# Patient Record
Sex: Male | Born: 1980 | Hispanic: Yes | Marital: Married | Smoking: Former smoker
Health system: Southern US, Community
[De-identification: ages and names within clinical notes are randomized; demographics above are authoritative.]

## PROBLEM LIST (undated history)

## (undated) DIAGNOSIS — F909 Attention-deficit hyperactivity disorder, unspecified type: Secondary | ICD-10-CM

## (undated) HISTORY — PX: LEG SURGERY: SHX1003

## (undated) HISTORY — DX: Attention-deficit hyperactivity disorder, unspecified type: F90.9

---

## 2004-10-19 ENCOUNTER — Emergency Department: Payer: Self-pay | Admitting: Emergency Medicine

## 2011-02-19 ENCOUNTER — Emergency Department: Payer: Self-pay

## 2011-02-19 DIAGNOSIS — R002 Palpitations: Secondary | ICD-10-CM

## 2011-02-26 ENCOUNTER — Ambulatory Visit (INDEPENDENT_AMBULATORY_CARE_PROVIDER_SITE_OTHER): Payer: Self-pay | Admitting: Cardiovascular Disease

## 2011-02-26 ENCOUNTER — Encounter: Payer: Self-pay | Admitting: Cardiovascular Disease

## 2011-02-26 VITALS — BP 136/83 | HR 60 | Ht 70.0 in | Wt 295.0 lb

## 2011-02-26 DIAGNOSIS — R002 Palpitations: Secondary | ICD-10-CM | POA: Insufficient documentation

## 2011-02-26 NOTE — Assessment & Plan Note (Signed)
Jonathan Lam presents with some episodes of palpitations. His Holter monitor revealed premature atrial contractions and premature ventricular contractions. I have reassured him that these are very benign entities. I asked him to try to eat better and exercise. He informed that he stop smoking last week and is also eating a lot healthier. He wants to lose some weight.  I will see him again on as-needed basis.

## 2011-02-26 NOTE — Progress Notes (Signed)
Jonathan Lam Date of Birth  Aug 22, 1980 [facility] / [facility] [address]     [address] [place], [place]  [address] [phone]  Fax  [phone]  History of Present Illness:  Pt has a hx of anxiety and palpitations.   Has started eating better and stopped smoking recently.  Feeling better.  Has issues with chest pain / GERD.  Feels better with belching.  Had a Holter monitor - PACs and PVCs.  Brother in [place] passed away 3 weeks ago - ( stressed).  Has lot of anxiety.  No current outpatient prescriptions on file prior to visit.    No Known Allergies  History reviewed. No pertinent past medical history.  History reviewed. No pertinent past surgical history.  History  Smoking status  . Former Smoker -- 1.0 packs/day for 5 years  . Types: Cigarettes  . Quit date: 02/20/2011  Smokeless tobacco  . Not on file    History  Alcohol Use  . 1.8 oz/week  . 1 Glasses of wine, 1 Cans of beer, 1 Shots of liquor per week    Family History  Problem Relation Age of Onset  . Hypertension Father   . Heart attack Paternal Grandmother     Reviw of Systems:  Reviewed in the HPI.  All other systems are negative.  Physical Exam: BP 136/83  Pulse 60  Ht 5\' 10"  (1.778 m)  Wt 295 lb (133.811 kg)  BMI 42.33 kg/m2 The patient is alert and oriented x 3.  The mood and affect are normal.   Skin: warm and dry.  Color is normal.    HEENT:   the sclera are nonicteric.  The mucous membranes are moist.  The carotids are 2+ without bruits.  There is no thyromegaly.  There is no JVD.    Lungs: clear.  The chest wall is non tender.    Heart: regular rate with a normal S1 and S2.  There are no murmurs, gallops, or rubs. The PMI is not displaced.     Abdomin: good bowel sounds.  There is no guarding or rebound.  There is no hepatosplenomegaly or tenderness.  There are no masses.   Extremities:  no clubbing, cyanosis, or edema.  The legs are without rashes.   The distal pulses are intact.   Neuro:  Cranial nerves II - XII are intact.  Motor and sensory functions are intact.    The gait is normal.  ECG: Normal sinus rhythm. He has early repolarization abnormality  Assessment / Plan:

## 2011-02-26 NOTE — Patient Instructions (Signed)
You are doing well. No medication changes were made. Please call us if you have new issues that need to be addressed    

## 2011-03-05 ENCOUNTER — Encounter: Payer: Self-pay | Admitting: Cardiovascular Disease

## 2011-03-20 ENCOUNTER — Encounter: Payer: Self-pay | Admitting: Cardiovascular Disease

## 2011-03-25 ENCOUNTER — Encounter: Payer: Self-pay | Admitting: Cardiovascular Disease

## 2011-03-31 ENCOUNTER — Encounter: Payer: Self-pay | Admitting: Cardiovascular Disease

## 2011-05-01 ENCOUNTER — Encounter: Payer: Self-pay | Admitting: Cardiovascular Disease

## 2011-07-25 ENCOUNTER — Emergency Department: Payer: Self-pay | Admitting: Emergency Medicine

## 2017-12-23 ENCOUNTER — Encounter: Payer: Self-pay | Admitting: Emergency Medicine

## 2017-12-23 ENCOUNTER — Other Ambulatory Visit: Payer: Self-pay

## 2017-12-23 ENCOUNTER — Emergency Department: Payer: 59

## 2017-12-23 ENCOUNTER — Emergency Department
Admission: EM | Admit: 2017-12-23 | Discharge: 2017-12-23 | Disposition: A | Discharge status: Home / Self Care | Payer: 59 | Attending: Emergency Medicine | Admitting: Emergency Medicine

## 2017-12-23 DIAGNOSIS — Z87891 Personal history of nicotine dependence: Secondary | ICD-10-CM | POA: Diagnosis not present

## 2017-12-23 DIAGNOSIS — W228XXA Striking against or struck by other objects, initial encounter: Secondary | ICD-10-CM | POA: Diagnosis not present

## 2017-12-23 DIAGNOSIS — Y9301 Activity, walking, marching and hiking: Secondary | ICD-10-CM | POA: Diagnosis not present

## 2017-12-23 DIAGNOSIS — S99922A Unspecified injury of left foot, initial encounter: Secondary | ICD-10-CM | POA: Diagnosis present

## 2017-12-23 DIAGNOSIS — Y929 Unspecified place or not applicable: Secondary | ICD-10-CM | POA: Diagnosis not present

## 2017-12-23 DIAGNOSIS — Y999 Unspecified external cause status: Secondary | ICD-10-CM | POA: Diagnosis not present

## 2017-12-23 DIAGNOSIS — S93105A Unspecified dislocation of left toe(s), initial encounter: Secondary | ICD-10-CM | POA: Diagnosis not present

## 2017-12-23 NOTE — ED Triage Notes (Signed)
Patient ambulatory to triage with steady gait, without difficulty or distress noted; pt reports injuring left 2nd toe tonight while going up steps

## 2017-12-23 NOTE — ED Provider Notes (Signed)
[facility] Emergency Department Provider Note  ____________________________________________   First MD Initiated Contact with Patient 12/23/17 [phone]     (approximate)  I have reviewed the triage vital signs and the nursing notes.   HISTORY  Chief Complaint Toe Injury   HPI Jonathan Lam is a 37 y.o. male who self presents to the emergency department with sudden onset severe pain to his left second toe that happened this evening when he was walking upstairs in sandals and he stubbed his toe.  He said he noticed immediately that the toe was "crooked and facing to the left".  He then grabbed his toe and "snapped into place".  His pain was severe at that time but immediately improved.  He comes to the emergency department today to see if his toe was broken.  He denies numbness or weakness.  His pain is largely resolved.  History reviewed. No pertinent past medical history.  Patient Active Problem List   Diagnosis Date Noted  . Palpitations 02/26/2011    History reviewed. No pertinent surgical history.  Prior to Admission medications   Not on File    Allergies Patient has no known allergies.  Family History  Problem Relation Age of Onset  . Hypertension Father   . Heart attack Paternal Grandmother     Social History Social History   Tobacco Use  . Smoking status: Former Smoker    Packs/day: 1.00    Years: 5.00    Pack years: 5.00    Types: Cigarettes    Last attempt to quit: 02/20/2011    Years since quitting: 6.8  . Smokeless tobacco: Never Used  Substance Use Topics  . Alcohol use: Yes    Alcohol/week: 1.8 oz    Types: 1 Glasses of wine, 1 Cans of beer, 1 Shots of liquor per week  . Drug use: No    Review of Systems Constitutional: No fever/chills Cardiovascular: Denies chest pain. Respiratory: Denies shortness of breath. Musculoskeletal: Positive for toe pain Neurological: Negative for  headaches   ____________________________________________   PHYSICAL EXAM:  VITAL SIGNS: ED Triage Vitals  Enc Vitals Group     BP 12/23/17 0049 (!) 146/66     Pulse Rate 12/23/17 0049 69     Resp 12/23/17 0049 18     Temp 12/23/17 0049 98 F (36.7 C)     Temp Source 12/23/17 0049 Oral     SpO2 12/23/17 0049 98 %     Weight 12/23/17 0048 280 lb (127 kg)     Height 12/23/17 0048  (1.778 m)     Head Circumference --      Peak Flow --      Pain Score 12/23/17 0048 3     Pain Loc --      Pain Edu? --      Excl. in GC? --     Constitutional: Alert and oriented x4 joking laughing well-appearing nontoxic no diaphoresis speaks full clear sentences Head: Atraumatic. Nose: No congestion/rhinnorhea. Mouth/Throat: No trismus Neck: No stridor.   Respiratory: Normal respiratory effort.  No retractions. MSK: Left second toe mildly tender although not deformed.  Neurovascularly intact Neurologic:  Normal speech and language. No gross focal neurologic deficits are appreciated.  Skin:  Skin is warm, dry and intact. No rash noted.    ____________________________________________  LABS (all labs ordered are listed, but only abnormal results are displayed)  Labs Reviewed - No data to display   __________________________________________  EKG  ____________________________________________  RADIOLOGY  X-ray of the toe reviewed by me with no acute disease ____________________________________________   DIFFERENTIAL includes but not limited to  Toe fracture, toe dislocation, subungual hematoma   PROCEDURES  Procedure(s) performed: no  Procedures  Critical Care performed: no  Observation: no ____________________________________________   INITIAL IMPRESSION / ASSESSMENT AND PLAN / ED COURSE  Pertinent labs & imaging results that were available during my care of the patient were reviewed by me and considered in my medical decision making (see chart for  details).  X-ray confirms no fracture.  The toe is well aligned and neurovascularly intact.  The patient likely did have a dislocation that he self reduced.  I have advised him to wear hard soled shoes and to follow-up with primary care as needed.  Discharged home in improved condition.  He declines pain medication at this time.      ____________________________________________   FINAL CLINICAL IMPRESSION(S) / ED DIAGNOSES  Final diagnoses:  Toe dislocation, left, initial encounter      NEW MEDICATIONS STARTED DURING THIS VISIT:  There are no discharge medications for this patient.    Note:  This document was prepared using Dragon voice recognition software and may include unintentional dictation errors.      Merrily Brittle, MD 12/23/17 2227

## 2017-12-23 NOTE — Discharge Instructions (Signed)
Please wear hard soled shoes for the next week and return to the ED for any concerns.  It was a pleasure to take care of you today, and thank you for coming to our emergency department.  If you have any questions or concerns before leaving please ask the nurse to grab me and I'm more than happy to go through your aftercare instructions again.  If you were prescribed any opioid pain medication today such as Norco, Vicodin, Percocet, morphine, hydrocodone, or oxycodone please make sure you do not drive when you are taking this medication as it can alter your ability to drive safely.  If you have any concerns once you are home that you are not improving or are in fact getting worse before you can make it to your follow-up appointment, please do not hesitate to call 911 and come back for further evaluation.  Merrily Brittle, MD  No results found for this or any previous visit. Dg Toe 2nd Left  Result Date: 12/23/2017 CLINICAL DATA:  Left second toe injury EXAM: LEFT SECOND TOE COMPARISON:  None. FINDINGS: No acute displaced fracture or malalignment. No radiopaque foreign body in the soft tissues. IMPRESSION: No acute osseous abnormality Electronically Signed   By: Jasmine Pang M.D.   On: 12/23/2017 01:12

## 2017-12-23 NOTE — ED Notes (Signed)
Sleeping soundly in lobby with no distress noted

## 2019-02-02 ENCOUNTER — Other Ambulatory Visit: Payer: Self-pay | Admitting: Family Medicine

## 2019-02-02 DIAGNOSIS — R1011 Right upper quadrant pain: Secondary | ICD-10-CM

## 2019-02-10 ENCOUNTER — Ambulatory Visit: Payer: 59

## 2019-02-24 ENCOUNTER — Other Ambulatory Visit: Payer: Self-pay

## 2019-02-24 DIAGNOSIS — Z20822 Contact with and (suspected) exposure to covid-19: Secondary | ICD-10-CM

## 2019-03-01 LAB — NOVEL CORONAVIRUS, NAA: SARS-CoV-2, NAA: NOT DETECTED

## 2019-03-13 ENCOUNTER — Observation Stay
Admission: EM | Admit: 2019-03-13 | Discharge: 2019-03-14 | Disposition: A | Discharge status: Home / Self Care | Payer: 59 | Attending: Internal Medicine | Admitting: Internal Medicine

## 2019-03-13 ENCOUNTER — Other Ambulatory Visit: Payer: Self-pay

## 2019-03-13 DIAGNOSIS — Z87891 Personal history of nicotine dependence: Secondary | ICD-10-CM | POA: Diagnosis not present

## 2019-03-13 DIAGNOSIS — T63441A Toxic effect of venom of bees, accidental (unintentional), initial encounter: Secondary | ICD-10-CM | POA: Diagnosis not present

## 2019-03-13 DIAGNOSIS — X58XXXA Exposure to other specified factors, initial encounter: Secondary | ICD-10-CM | POA: Insufficient documentation

## 2019-03-13 DIAGNOSIS — Z20828 Contact with and (suspected) exposure to other viral communicable diseases: Secondary | ICD-10-CM | POA: Insufficient documentation

## 2019-03-13 DIAGNOSIS — T782XXA Anaphylactic shock, unspecified, initial encounter: Secondary | ICD-10-CM | POA: Diagnosis present

## 2019-03-13 LAB — CBC
HCT: 43.6 % (ref 39.0–52.0)
Hemoglobin: 14.7 g/dL (ref 13.0–17.0)
MCH: 27.7 pg (ref 26.0–34.0)
MCHC: 33.7 g/dL (ref 30.0–36.0)
MCV: 82.3 fL (ref 80.0–100.0)
Platelets: 324 10*3/uL (ref 150–400)
RBC: 5.3 MIL/uL (ref 4.22–5.81)
RDW: 12.3 % (ref 11.5–15.5)
WBC: 19.7 10*3/uL — ABNORMAL HIGH (ref 4.0–10.5)
nRBC: 0 % (ref 0.0–0.2)

## 2019-03-13 LAB — CREATININE, SERUM
Creatinine, Ser: 1.01 mg/dL (ref 0.61–1.24)
GFR calc Af Amer: >60 mL/min (ref >60)
GFR calc non Af Amer: >60 mL/min (ref >60)

## 2019-03-13 MED ORDER — FAMOTIDINE IN NACL 20-0.9 MG/50ML-% IV SOLN
20.0000 mg | Freq: Two times a day (BID) | INTRAVENOUS | Status: DC
  Administered 2019-03-13: 23:00:00 20 mg via INTRAVENOUS
  Filled 2019-03-13: qty 50

## 2019-03-13 MED ORDER — DIPHENHYDRAMINE HCL 50 MG/ML IJ SOLN
50.0000 mg | Freq: Once | INTRAMUSCULAR | Status: AC
  Administered 2019-03-13: 50 mg via INTRAVENOUS

## 2019-03-13 MED ORDER — FAMOTIDINE IN NACL 20-0.9 MG/50ML-% IV SOLN
20.0000 mg | Freq: Once | INTRAVENOUS | Status: AC
  Administered 2019-03-13: 20 mg via INTRAVENOUS

## 2019-03-13 MED ORDER — EPINEPHRINE PF 1 MG/ML IJ SOLN
0.3000 mL | Freq: Once | INTRAMUSCULAR | Status: AC
  Administered 2019-03-13: 19:00:00 0.3 mg via INTRAMUSCULAR

## 2019-03-13 MED ORDER — ACETAMINOPHEN 650 MG RE SUPP: 650.0000 mg | Freq: Four times a day (QID) | RECTAL | Status: DC | PRN

## 2019-03-13 MED ORDER — SODIUM CHLORIDE 0.9 % IV BOLUS
1000.0000 mL | Freq: Once | INTRAVENOUS | Status: AC
  Administered 2019-03-13: 1000 mL via INTRAVENOUS

## 2019-03-13 MED ORDER — ACETAMINOPHEN 325 MG PO TABS: 650.0000 mg | ORAL_TABLET | Freq: Four times a day (QID) | ORAL | Status: DC | PRN

## 2019-03-13 MED ORDER — ONDANSETRON HCL 4 MG PO TABS: 4.0000 mg | ORAL_TABLET | Freq: Four times a day (QID) | ORAL | Status: DC | PRN

## 2019-03-13 MED ORDER — ONDANSETRON HCL 4 MG/2ML IJ SOLN: 4.0000 mg | Freq: Four times a day (QID) | INTRAMUSCULAR | Status: DC | PRN

## 2019-03-13 MED ORDER — METHYLPREDNISOLONE SODIUM SUCC 125 MG IJ SOLR
60.0000 mg | Freq: Two times a day (BID) | INTRAMUSCULAR | Status: DC
  Administered 2019-03-13 – 2019-03-14 (×2): 60 mg via INTRAVENOUS
  Filled 2019-03-13 (×2): qty 2

## 2019-03-13 MED ORDER — ENOXAPARIN SODIUM 40 MG/0.4ML ~~LOC~~ SOLN
40.0000 mg | SUBCUTANEOUS | Status: DC
  Administered 2019-03-13: 40 mg via SUBCUTANEOUS
  Filled 2019-03-13: qty 0.4

## 2019-03-13 MED ORDER — DIPHENHYDRAMINE HCL 25 MG PO CAPS
25.0000 mg | ORAL_CAPSULE | Freq: Three times a day (TID) | ORAL | Status: DC | PRN
  Administered 2019-03-14: 11:00:00 25 mg via ORAL
  Filled 2019-03-13: qty 1

## 2019-03-13 NOTE — H&P (Signed)
[facility] [place] [facility]   PATIENT NAME: Jonathan Lam    MR#:  [id number]  DATE OF BIRTH:  May 18, 1981  DATE OF ADMISSION:  03/13/2019  PRIMARY CARE PHYSICIAN: Mickel FuchsWroth, Thomas H, MD   REQUESTING/REFERRING PHYSICIAN: Dorothea GlassmanPaul Malinda, MD  CHIEF COMPLAINT:   Chief Complaint  Patient presents with  . Allergic Reaction    HISTORY OF PRESENT ILLNESS:  Jonathan Lam  is a 38 y.o. male with no known prior medical illness.  He presented to the emergency room via EMS services after being stung by 3 yellow jackets on his right side of his head, right shoulder, and right hand.  He received EpiPen dose 45 minutes prior to arrival as well as 50 mg of Benadryl and 125 mg Solu-Medrol as well as 4 mg IV Zofran.  According to EMS services, oxygen saturations were 60% on room air on their arrival to the scene.  On arrival to the emergency room patient had nonrebreather in place with 100% oxygen saturations.  Patient was also noted to be hypotensive therefore he received an additional dose of epinephrine IM and 50 mg Benadryl and Solu-Medrol 125 mg as well as 20 mg IV Pepcid in the emergency room.  He continued to have erythema as well as some edema and therefore received 1 additional dose of epinephrine with complete clearing of the erythema.  Hypotension has improved.  Patient is currently awake, alert, and oriented x4 at the time that I am seeing him.  He denies any shortness of breath or pain.  He denies difficulty swallowing.  He denies urticaria.  He has been hospitalized with admission to the hospitalist service for observation overnight given anaphylactic reaction with prolonged symptoms.  PAST MEDICAL HISTORY:  No past medical history on file.  PAST SURGICAL HISTORY:  No past surgical history on file.  SOCIAL HISTORY:   Social History   Tobacco Use  . Smoking status: Former Smoker    Packs/day: 1.00    Years: 5.00    Pack years: 5.00    Types: Cigarettes   Quit date: 02/20/2011    Years since quitting: 8.0  . Smokeless tobacco: Never Used  Substance Use Topics  . Alcohol use: Yes    Alcohol/week: 3.0 standard drinks    Types: 1 Glasses of wine, 1 Cans of beer, 1 Shots of liquor per week    FAMILY HISTORY:   Family History  Problem Relation Age of Onset  . Hypertension Father   . Heart attack Paternal Grandmother     DRUG ALLERGIES:   Allergies  Allergen Reactions  . Yellow Jacket Venom [Bee Venom] Anaphylaxis    REVIEW OF SYSTEMS:   Review of Systems  Constitutional: Negative for chills, fever and malaise/fatigue.  HENT: Negative for congestion, hearing loss, sinus pain and sore throat.   Eyes: Negative for blurred vision, double vision and pain.  Respiratory: Negative for cough, hemoptysis, shortness of breath and wheezing.   Cardiovascular: Negative for chest pain and palpitations.  Gastrointestinal: Negative for abdominal pain, constipation, diarrhea, heartburn, nausea and vomiting.  Genitourinary: Negative for dysuria, flank pain and hematuria.  Musculoskeletal: Negative for falls and myalgias.  Skin: Negative for itching and rash.       Stung by yellow jacket on left hand, right side of his head, and right shoulder  Neurological: Negative for dizziness, focal weakness, seizures, loss of consciousness, weakness and headaches.  Psychiatric/Behavioral: Negative.  Negative for depression.     MEDICATIONS AT HOME:  Prior to Admission medications   Medication Sig Start Date End Date Taking? Authorizing Provider  neomycin-polymyxin b-dexamethasone (MAXITROL) 3.5-10000-0.1 SUSP Place 1 drop into both eyes daily. 02/25/19   [provider]      VITAL SIGNS:  Blood pressure 127/62, pulse 64, temperature 98.1 F (36.7 C), temperature source Oral, resp. rate 18, height 5\' 8"  (1.727 m), weight 130.6 kg, SpO2 100 %.  PHYSICAL EXAMINATION:  Physical Exam  GENERAL:  38 y.o.-year-old patient lying in the bed with no  acute distress.  EYES: Pupils equal, round, reactive to light and accommodation. No scleral icterus. Extraocular muscles intact.  HEENT: Head atraumatic, normocephalic. Oropharynx and nasopharynx clear.  NECK:  Supple, no jugular venous distention. No thyroid enlargement, no tenderness.  LUNGS: Normal breath sounds bilaterally, no wheezing, rales,rhonchi or crepitation. No use of accessory muscles of respiration.  CARDIOVASCULAR: Regular rate and rhythm, S1, S2 normal. No murmurs, rubs, or gallops.  ABDOMEN: Soft, nondistended, nontender. Bowel sounds present. No organomegaly or mass.  EXTREMITIES: No pedal edema, cyanosis, or clubbing.  NEUROLOGIC: Cranial nerves II through XII are intact. Muscle strength 5/5 in all extremities. Sensation intact. Gait not checked.  PSYCHIATRIC: The patient is alert and oriented x 3.  Normal affect and good eye contact. SKIN:erythemma and slight edema to right hand  LABORATORY PANEL:   CBC Recent Labs  Lab 03/13/19 2141  WBC 19.7*  HGB 14.7  HCT 43.6  PLT 324   ------------------------------------------------------------------------------------------------------------------  Chemistries  Recent Labs  Lab 03/13/19 2141  CREATININE 1.01   ------------------------------------------------------------------------------------------------------------------  Cardiac Enzymes No results for input(s): TROPONINI in the last 168 hours. ------------------------------------------------------------------------------------------------------------------  RADIOLOGY:  No results found.    IMPRESSION AND PLAN:   1.  Anaphylaxis to bee sting - Patient received a total of 2 doses IV Solu-Medrol with 100 mg IV Benadryl and epinephrine dose x3 as well as 20 mg IV Pepcid - We will continue additional 2 doses of Solu-Medrol with PRN Benadryl and twice daily Pepcid. - Patient admitted for overnight observation with prolonged symptomatology  2. hypotension  -Improved with epinephrine -Continue to monitor every 4 hour blood pressure -Telemetry monitoring -Patient received 1 L normal saline bolus in the emergency room  3.  Leukocytosis - Likely secondary to anaphylactic reaction and receiving IV steroid therapy -Repeat CBC in the a.m.  DVT and PPI prophylaxis    All the records are reviewed and case discussed with ED provider. The plan of care was discussed in details with the patient (and family). I answered all questions. The patient agreed to proceed with the above mentioned plan. Further management will depend upon hospital course.   CODE STATUS: Full code  TOTAL TIME TAKING CARE OF THIS PATIENT: 45 minutes.    Theo Dills Roopa Graver crnpon 03/13/2019 at 11:38 PM  Pager - [phone]  After 6pm go to [email or web address] - Proofreader  [facility]  Office  [phone]  CC: Primary care physician; Elba Barman, MD   Note: This dictation was prepared with Dragon dictation along with smaller phrase technology. Any transcriptional errors that result from this process are unintentional.

## 2019-03-13 NOTE — ED Notes (Signed)
BP cycling q39min

## 2019-03-13 NOTE — ED Notes (Signed)
Pt took off NRB. Pt sating 98%on RA at this time.

## 2019-03-13 NOTE — ED Notes (Signed)
Pt provided with a meal tray, water and warm blankets.

## 2019-03-13 NOTE — ED Notes (Addendum)
Pt oxygen turned down to 13 L via NRB. Sating at 100%. VO MD Cinda Quest

## 2019-03-13 NOTE — ED Notes (Signed)
Eyes closed, resp even and unlabored; pt wearing non-rebreather; sats 100%

## 2019-03-13 NOTE — ED Notes (Signed)
Pt awake, talking on cell phone with wife; noted to have removed non-rebreather since last observed-sats 99-100% on room air; pt says it was irritating him; pt given mask as requested; says feeling some better; side rails up x 2; call bell in reach; reports his "butt hole is burning"; notified MD and acknowledged;

## 2019-03-13 NOTE — Progress Notes (Signed)
Responded to patient arrival. No RT interventions needed at this time.

## 2019-03-13 NOTE — ED Notes (Addendum)
Pt oxygen turned down to 10 L via NRB. Sating at 100%. VO MD Cinda Quest

## 2019-03-13 NOTE — ED Provider Notes (Signed)
[facility] Emergency Department Provider Note   ____________________________________________   First MD Initiated Contact with Patient 03/13/19 2107     (approximate)  I have reviewed the triage vital signs and the nursing notes.   HISTORY  Chief Complaint Allergic Reaction    HPI Jonathan Lam is a 38 y.o. male who was up on his rib pain in his shoulders when he was stung by 3 yellow jackets.  He reports he almost passed out.  EMS gave him an EpiPen about 45 minutes prior to arrival and 50 mg of Benadryl 125 sign Medrol and 4 of Zofran.  They report his O2 sats were 60% upon arrival there.  He is on 100% nonrebreather satting at 100%.  On arrival here he is not wheezing any longer but he is diffusely red and itchy.  His blood pressure is low and drops down to about 818 systolic.  We give him another epi 0.3 IM another 50 Benadryl Solu-Medrol and 20 of Pepcid IV.  Patient looks less red and itchy blood pressure comes up and then goes down again.  He received further epinephrine which results in complete clearing of his redness raising of his blood pressure and pulse rate and a brief headache which resolved.  Because of patient's anaphylactic reaction to bee sting and prolonged symptoms I will give him in the hospital overnight we will watch him.         No past medical history on file.  Patient Active Problem List   Diagnosis Date Noted  . 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 Palpitations 02/26/2011    No past surgical history on file.  Prior to Admission medications   Medication Sig Start Date End Date Taking? Authorizing Provider  neomycin-polymyxin b-dexamethasone (MAXITROL) 3.5-10000-0.1 SUSP Place 1 drop into both eyes daily. 02/25/19   [provider]    Allergies Yellow jacket venom [bee venom]  Family History  Problem Relation Age of Onset  . Hypertension Father   . Heart attack Paternal Grandmother     Social  History Social History   Tobacco Use  . Smoking status: Former Smoker    Packs/day: 1.00    Years: 5.00    Pack years: 5.00    Types: Cigarettes    Quit date: 02/20/2011    Years since quitting: 8.0  . Smokeless tobacco: Never Used  Substance Use Topics  . Alcohol use: Yes    Alcohol/week: 3.0 standard drinks    Types: 1 Glasses of wine, 1 Cans of beer, 1 Shots of liquor per week  . Drug use: No    Review of Systems  Constitutional: No fever/chills Eyes: No visual changes. ENT: No sore throat. Cardiovascular: Denies chest pain. Respiratory: shortness of breath. Gastrointestinal: No abdominal pain.  No nausea, no vomiting.  No diarrhea.  No constipation. Genitourinary: Negative for dysuria. Musculoskeletal: Negative for back pain. Skin: Diffuse rash. Neurological: Negative for headaches, focal weakness   ____________________________________________   PHYSICAL EXAM:  VITAL SIGNS: ED Triage Vitals  Enc Vitals Group     BP 03/13/19 1846 (!) 154/139     Pulse Rate 03/13/19 1852 72     Resp 03/13/19 1852 17     Temp --      Temp src --      SpO2 03/13/19 1852 100 %     Weight 03/13/19 1907 288 lb (130.6 kg)     Height 03/13/19 1907 5\' 8"  (1.727 m)  Head Circumference --      Peak Flow --      Pain Score --      Pain Loc --      Pain Edu? --      Excl. in GC? --     Constitutional: Alert and oriented.  Feels ill and tired Eyes: Conjunctivae are normal. PERRL. EOMI. Head: Atraumatic. Nose: No congestion/rhinnorhea. Mouth/Throat: Mucous membranes are moist.  Oropharynx non-erythematous. Neck: No stridor. Cardiovascular: Normal rate, regular rhythm. Grossly normal heart sounds.  Good peripheral circulation. Respiratory: Normal respiratory effort.  No retractions. Lungs CTAB. Gastrointestinal: Soft and nontender. No distention. No abdominal bruits. No CVA tenderness. Musculoskeletal: No lower extremity tenderness nor edema.   Neurologic:  Normal speech and  language. No gross focal neurologic deficits are appreciated.  Skin: Skin is sweaty diffusely red there are some hives on the legs.  ____________________________________________   LABS (all labs ordered are listed, but only abnormal results are displayed)  Labs Reviewed  SARS CORONAVIRUS 2  HIV ANTIBODY (ROUTINE TESTING W REFLEX)  CBC  CREATININE, SERUM  BASIC METABOLIC PANEL  CBC   ____________________________________________  EKG  EKG read and interpreted by me shows normal sinus rhythm rate of 82 normal axis there is some PR segment depression in lead II baseline irregularity.  No acute ST changes.  Computer is reading borderline ST elevation laterally I believe this is more PR segment depression. ____________________________________________  RADIOLOGY  ED MD interpretation:    Official radiology report(s): No results found.  ____________________________________________   PROCEDURES  Procedure(s) performed (including Critical Care): Medical care time 45 minutes this is at the bedside.  Procedures   ____________________________________________   INITIAL IMPRESSION / ASSESSMENT AND PLAN / ED COURSE  Jonathan Lam was evaluated in Emergency Department on 03/13/2019 for the symptoms described in the history of present illness. He was evaluated in the context of the global COVID-19 pandemic, which necessitated consideration that the patient might be at risk for infection with the SARS-CoV-2 virus that causes COVID-19. Institutional protocols and algorithms that pertain to the evaluation of patients at risk for COVID-19 are in a state of rapid change based on information released by regulatory bodies including the CDC and federal and state organizations. These policies and algorithms were followed during the patient's care in the ED.    Patient is stable with the blood pressure in the 100 and teens systolic diastolic in the high 50s low 60s respiratory rate 11 heart rate  in the 70s to 80s.  He looks much better now we will get him admitted and monitored to make sure he does not deteriorate again.         ____________________________________________   FINAL CLINICAL IMPRESSION(S) / ED DIAGNOSES  Final diagnoses:  None     ED Discharge Orders    None       Note:  This document was prepared using Dragon voice recognition software and may include unintentional dictation errors.    Arnaldo NatalMalinda, Day Greb F, MD 03/13/19 2206

## 2019-03-13 NOTE — ED Notes (Signed)
BP cycling q44min.

## 2019-03-13 NOTE — ED Notes (Signed)
Selas MD Provider at bedside.

## 2019-03-13 NOTE — ED Notes (Signed)
Pt  AO x 4

## 2019-03-13 NOTE — ED Triage Notes (Addendum)
Pt from home via [facility]. Per EMS, pt stung by a yellow jacket about an hour ago. Meds given by [facility]; Epi pen given 61min ago; 50mg  benadryl; 125 solumendrol; 4 zofran. Per EMS pt oxygen level 60% upon arrival to pt's home,  pt arrives on 15L via NRB sating 100%. Redness noted all over body, scratches on chest and legs noted.  MD MAlinda at bedside .

## 2019-03-13 NOTE — ED Notes (Signed)
Spouse at bedside at this time.

## 2019-03-14 LAB — CBC
HCT: 42.6 % (ref 39.0–52.0)
Hemoglobin: 14.1 g/dL (ref 13.0–17.0)
MCH: 27.4 pg (ref 26.0–34.0)
MCHC: 33.1 g/dL (ref 30.0–36.0)
MCV: 82.7 fL (ref 80.0–100.0)
Platelets: 329 10*3/uL (ref 150–400)
RBC: 5.15 MIL/uL (ref 4.22–5.81)
RDW: 12.5 % (ref 11.5–15.5)
WBC: 9.7 10*3/uL (ref 4.0–10.5)
nRBC: 0 % (ref 0.0–0.2)

## 2019-03-14 LAB — BASIC METABOLIC PANEL
Anion gap: 4 — ABNORMAL LOW (ref 5–15)
BUN: 15 mg/dL (ref 6–20)
CO2: 22 mmol/L (ref 22–32)
Calcium: 8.6 mg/dL — ABNORMAL LOW (ref 8.9–10.3)
Chloride: 113 mmol/L — ABNORMAL HIGH (ref 98–111)
Creatinine, Ser: 0.84 mg/dL (ref 0.61–1.24)
GFR calc Af Amer: >60 mL/min (ref >60)
GFR calc non Af Amer: >60 mL/min (ref >60)
Glucose, Bld: 168 mg/dL — ABNORMAL HIGH (ref 70–99)
Potassium: 4.2 mmol/L (ref 3.5–5.1)
Sodium: 139 mmol/L (ref 135–145)

## 2019-03-14 LAB — SARS CORONAVIRUS 2 (TAT 6-24 HRS): SARS Coronavirus 2: NEGATIVE

## 2019-03-14 MED ORDER — EPINEPHRINE 0.3 MG/0.3ML IJ SOAJ: 0.3000 mg | INTRAMUSCULAR | 0 refills | Status: DC | PRN

## 2019-03-14 MED ORDER — PREDNISONE 10 MG PO TABS: ORAL_TABLET | ORAL | 0 refills | Status: DC

## 2019-03-14 MED ORDER — EPINEPHRINE 0.3 MG/0.3ML IJ SOAJ: 0.3000 mg | INTRAMUSCULAR | 0 refills | Status: AC | PRN

## 2019-03-14 NOTE — Progress Notes (Signed)
Patient ID: Jonathan Lam   Patient was admitted to [facility] on 03/13/2019 and discharged 03/14/2019.  May return to work on Friday 03/18/2019.  Dr Loletha Grayer [phone]

## 2019-03-14 NOTE — Discharge Summary (Signed)
[facility] [place] NAME: Jonathan Lam    MR#:  [id number]  DATE OF BIRTH:  08/28/1980  DATE OF ADMISSION:  03/13/2019 ADMITTING PHYSICIAN: Christel Mormon, MD  DATE OF DISCHARGE: 03/14/2019 12:54 PM  PRIMARY CARE PHYSICIAN: Elba Barman, MD    ADMISSION DIAGNOSIS:  Anaphylaxis, initial encounter [T78.2XXA]  DISCHARGE DIAGNOSIS:  Active Problems:   Anaphylactic reaction to bee sting   SECONDARY DIAGNOSIS:  History reviewed. No pertinent past medical history.  HOSPITAL COURSE:   1.  Anaphylactic reaction to yellow jacket stings.  Patient had shortness of breath hives and swelling.  Patient feeling much better now.  Patient received epinephrine steroids and Pepcid.  PRN Benadryl.  I will prescribe a prednisone taper upon going home.  I also prescribed an EpiPen generic.  Refer to allergist as outpatient. 2.  COVID-19 test was done as a send out and still pending at this time.  He did have one a few weeks ago which was negative.   DISCHARGE CONDITIONS:   Satisfactory  CONSULTS OBTAINED:  None  DRUG ALLERGIES:   Allergies  Allergen Reactions  . Yellow Jacket Venom [Bee Venom] Anaphylaxis    DISCHARGE MEDICATIONS:   Allergies as of 03/14/2019      Reactions   Yellow Jacket Venom [bee Venom] Anaphylaxis      Medication List    TAKE these medications   EPINEPHrine 0.3 mg/0.3 mL Soaj injection Commonly known as: EPI-PEN Inject 0.3 mLs (0.3 mg total) into the muscle as needed for anaphylaxis. Can substitute generic (or cheapest)   neomycin-polymyxin b-dexamethasone 3.5-10000-0.1 Susp Commonly known as: MAXITROL Place 1 drop into both eyes daily.   predniSONE 10 MG tablet Commonly known as: DELTASONE 4 tabs po day 1; 3 tabs po day2; 2 tabs po day3; 1 tab po day4; 1/2 tab po day5,6        DISCHARGE INSTRUCTIONS:   Follow-up PMD 5 days  If you experience worsening of your admission symptoms, develop shortness of  breath, life threatening emergency, suicidal or homicidal thoughts you must seek medical attention immediately by calling 911 or calling your MD immediately  if symptoms less severe.  You Must read complete instructions/literature along with all the possible adverse reactions/side effects for all the Medicines you take and that have been prescribed to you. Take any new Medicines after you have completely understood and accept all the possible adverse reactions/side effects.   Please note  You were cared for by a hospitalist during your hospital stay. If you have any questions about your discharge medications or the care you received while you were in the hospital after you are discharged, you can call the unit and asked to speak with the hospitalist on call if the hospitalist that took care of you is not available. Once you are discharged, your primary care physician will handle any further medical issues. Please note that NO REFILLS for any discharge medications will be authorized once you are discharged, as it is imperative that you return to your primary care physician (or establish a relationship with a primary care physician if you do not have one) for your aftercare needs so that they can reassess your need for medications and monitor your lab values.    Today   CHIEF COMPLAINT:   Chief Complaint  Patient presents with  . Allergic Reaction    HISTORY OF PRESENT ILLNESS:  Jonathan Lam  is a 38 y.o. male presented after allergic  reaction after yellowjacket stings   VITAL SIGNS:  Blood pressure 116/65, pulse 75, temperature 98.3 F (36.8 C), temperature source Oral, resp. rate 18, height 5\' 8"  (1.727 m), weight 130.6 kg, SpO2 99 %.   PHYSICAL EXAMINATION:  GENERAL:  38 y.o.-year-old patient lying in the bed with no acute distress.  EYES: Pupils equal, round, reactive to light and accommodation. No scleral icterus. Extraocular muscles intact.  HEENT: Head atraumatic,  normocephalic. Oropharynx and nasopharynx clear.  NECK:  Supple, no jugular venous distention. No thyroid enlargement, no tenderness.  LUNGS: Normal breath sounds bilaterally, no wheezing, rales,rhonchi or crepitation. No use of accessory muscles of respiration.  CARDIOVASCULAR: S1, S2 normal. No murmurs, rubs, or gallops.  ABDOMEN: Soft, non-tender, non-distended. Bowel sounds present. No organomegaly or mass.  EXTREMITIES: No pedal edema, cyanosis, or clubbing.  NEUROLOGIC: Cranial nerves II through XII are intact. Muscle strength 5/5 in all extremities. Sensation intact. Gait not checked.  PSYCHIATRIC: The patient is alert and oriented x 3.  SKIN: No obvious rash, lesion, or ulcer.   DATA REVIEW:   CBC Recent Labs  Lab 03/14/19 0414  WBC 9.7  HGB 14.1  HCT 42.6  PLT 329    Chemistries  Recent Labs  Lab 03/14/19 0414  NA 139  K 4.2  CL 113*  CO2 22  GLUCOSE 168*  BUN 15  CREATININE 0.84  CALCIUM 8.6*     Microbiology Results  Results for orders placed or performed in visit on 02/24/19  Novel Coronavirus, NAA ([facility])     Status: None   Collection Time: 02/24/19 12:00 AM  Result Value Ref Range Status   SARS-CoV-2, NAA Not Detected Not Detected Final    Comment: Testing was performed using the cobas(R) SARS-CoV-2 test. This test was developed and its performance characteristics determined by [facility]. This test has not been FDA cleared or approved. This test has been authorized by FDA under an Emergency Use Authorization (EUA). This test is only authorized for the duration of time the declaration that circumstances exist justifying the authorization of the emergency use of in vitro diagnostic tests for detection of SARS-CoV-2 virus and/or diagnosis of COVID-19 infection under section 564(b)(1) of the Act, 21 U.S.C. 295AOZ-3(Y)(8360bbb-3(b)(1), unless the authorization is terminated or revoked sooner. When diagnostic testing is negative, the possibility of a  false negative result should be considered in the context of a patient's recent exposures and the presence of clinical signs and symptoms consistent with COVID-19. An individual without symptoms of COVID-19 and who is not shedding SARS-CoV-2 virus would expect to have a negati ve (not detected) result in this assay.        Management plans discussed with the patient, and he is agreement.  CODE STATUS:  Code Status History    Date Active Date Inactive Code Status Order ID Comments User Context   03/13/2019 2106 03/14/2019 1600 Full Code [id number]  Pearletha AlfredSeals, Angela H, NP ED   Advance Care Planning Activity      TOTAL TIME TAKING CARE OF THIS PATIENT: 35 minutes.    Alford Highlandichard Taite Schoeppner M.D on 03/14/2019 at 4:24 PM  Between 7am to 6pm - Pager - [phone]  After 6pm go to [email or web address] - password [email or web address]  [facility] Office  [phone]  CC: Primary care physician; Mickel FuchsWroth, Thomas H, MD

## 2019-03-14 NOTE — Progress Notes (Signed)
Pt is being discharge home.  Discharge papers given and explained to pt. Pt verbalized understanding.  Meds and f/u appointments reviewed. Rx given. Awaiting transportation.

## 2019-03-14 NOTE — Progress Notes (Signed)
Pt c/o itching to bilateral hands. Denies sob.  Dr Leslye Peer made aware.  Per MD to give benadryl and ok to d/c home.

## 2019-03-15 LAB — HIV ANTIBODY (ROUTINE TESTING W REFLEX): HIV Screen 4th Generation wRfx: NONREACTIVE

## 2019-03-16 ENCOUNTER — Telehealth: Payer: 59 | Admitting: Family

## 2019-03-16 DIAGNOSIS — T63441A Toxic effect of venom of bees, accidental (unintentional), initial encounter: Secondary | ICD-10-CM

## 2019-03-16 NOTE — Progress Notes (Signed)
Based on what you shared with me, I feel your condition warrants further evaluation and I recommend that you be seen for a face to face office visit.  Given your symptoms, it would be best if you were evaluated face to face to rule out causes. If you are still having any swelling or shortness of breath you need to go to the ED now!   NOTE: If you entered your credit card information for this eVisit, you will not be charged. You may see a "hold" on your card for the $35 but that hold will drop off and you will not have a charge processed.  If you are having a true medical emergency please call 911.     For an urgent face to face visit, [place] has five urgent care centers for your convenience:    [email or web address] to reserve your spot online an avoid wait times  [facility] (New Address!) [address] [place], [place] [address] *Just off [address], across the road from [facility] hours of operation: Monday-Friday, 12 PM to 6 PM  Closed Saturday & Sunday   The following sites will take your insurance:  . [facility]    [phone]                  Get Driving Directions  [address], [place] [address] . 10 am to 8 pm Monday-Friday . 12 pm to 8 pm Saturday-Sunday   . [facility] at [place]                  Get Driving Directions  [address], [place], [place] [address] . 8 am to 8 pm Monday-Friday . 9 am to 6 pm Saturday . 11 am to 6 pm Sunday   . [facility] at [place]                  Get Driving Directions   [address] [place], [place] [address] . 8 am to 8 pm Monday-Friday . 8 am to 4 pm Saturday-Sunday    . [facility] at [place]                    Get Driving Directions  [phone]  [address] [place], [place] [address]  .  Monday-Friday, 12 PM to 6 PM    Your e-visit answers were reviewed by a board certified advanced clinical practitioner to complete your personal care plan.  Thank you for using e-Visits.

## 2019-05-24 ENCOUNTER — Other Ambulatory Visit: Payer: Self-pay

## 2019-05-24 DIAGNOSIS — Z20822 Contact with and (suspected) exposure to covid-19: Secondary | ICD-10-CM

## 2019-05-26 LAB — NOVEL CORONAVIRUS, NAA: SARS-CoV-2, NAA: NOT DETECTED

## 2019-08-30 ENCOUNTER — Ambulatory Visit (INDEPENDENT_AMBULATORY_CARE_PROVIDER_SITE_OTHER): Payer: 59 | Admitting: Cardiology

## 2019-08-30 ENCOUNTER — Other Ambulatory Visit: Payer: Self-pay

## 2019-08-30 ENCOUNTER — Encounter: Payer: Self-pay | Admitting: Cardiology

## 2019-08-30 VITALS — BP 123/80 | HR 61 | Ht 70.0 in | Wt 295.5 lb

## 2019-08-30 DIAGNOSIS — K2100 Gastro-esophageal reflux disease with esophagitis, without bleeding: Secondary | ICD-10-CM

## 2019-08-30 DIAGNOSIS — F419 Anxiety disorder, unspecified: Secondary | ICD-10-CM

## 2019-08-30 DIAGNOSIS — F17200 Nicotine dependence, unspecified, uncomplicated: Secondary | ICD-10-CM | POA: Diagnosis not present

## 2019-08-30 NOTE — Patient Instructions (Addendum)
Medication Instructions:  - Your physician has recommended you make the following change in your medication:   1) You may take prilosec over the counter: take 1 tablet (20 mg) by mouth once daily   *If you need a refill on your cardiac medications before your next appointment, please call your pharmacy*  Lab Work: - none ordered  If you have labs (blood work) drawn today and your tests are completely normal, you will receive your results only by: Marland Kitchen MyChart Message (if you have MyChart) OR . A paper copy in the mail If you have any lab test that is abnormal or we need to change your treatment, we will call you to review the results.  Testing/Procedures: - none ordered  Follow-Up: At [facility], you and your health needs are our priority.  As part of our continuing mission to provide you with exceptional heart care, we have created designated Provider Care Teams.  These Care Teams include your primary Cardiologist (physician) and Advanced Practice Providers (APPs -  Physician Assistants and Nurse Practitioners) who all work together to provide you with the care you need, when you need it.  Your next appointment:    as needed  The format for your next appointment:   n/a  Provider:   Debbe Odea, MD  Other Instructions Follow up with your Primary Care Doctor

## 2019-08-30 NOTE — Progress Notes (Signed)
Cardiology Office Note:    Date:  08/30/2019   ID:  Jonathan Lam, DOB 02/11/1981, MRN [id number]  PCP:  Jonathan Barman, MD  Cardiologist:  Jonathan Sable, MD  Electrophysiologist:  None   Referring MD: Jonathan Barman, MD   Chief Complaint  Patient presents with  . OTHER    C/o irregular heart beat. Meds reviewed verbally with pt.    History of Present Illness:    Jonathan Lam is a 39 y.o. male with a hx of anxiety, smoker who presents due to abnormal heartbeats.  Patient states having symptoms of anxiety and being anxious over the past 10 to 15 years now.  He has not really sought out treatment for anxiety.  Sometimes he has acid reflux symptoms which trigger him becoming anxious.  When he becomes anxious, he noticed his heart racing and resolves when he calms down.  A month ago, he got out of his car too quickly and felt a little bit dizzy.  This has not happened since.  He is also anxious regarding his weight.  He used to smoke cigarettes but currently uses E cigarettes.  He complains of generalized pain in his left arm, right leg, sometimes chest, and face.  Also has some numbness and tingling in the fingers when he is anxious, associated with rapid heart rates.  He denies shortness of breath.  Symptoms are not related with exertion.  History reviewed. No pertinent past medical history.  Past Surgical History:  Procedure Laterality Date  . LEG SURGERY Right     Current Medications: Current Meds  Medication Sig  . Cyanocobalamin (B-12 PO) Take by mouth daily.  Marland Kitchen EPINEPHrine 0.3 mg/0.3 mL IJ SOAJ injection Inject 0.3 mLs (0.3 mg total) into the muscle as needed for anaphylaxis. Can substitute generic (or cheapest)  . ergocalciferol (VITAMIN D2) 1.25 MG (50000 UT) capsule Take 50,000 Units by mouth once a week.     Allergies:   Yellow jacket venom [bee venom]   Social History   Socioeconomic History  . Marital status: Married    Spouse name: Not on file  .  Number of children: Not on file  . Years of education: Not on file  . Highest education level: Not on file  Occupational History  . Not on file  Tobacco Use  . Smoking status: Current Every Day Smoker    Packs/day: 1.50    Years: 20.00    Pack years: 30.00    Types: Cigarettes    Last attempt to quit: 02/20/2011    Years since quitting: 8.5  . Smokeless tobacco: Never Used  Substance and Sexual Activity  . Alcohol use: Not Currently    Alcohol/week: 3.0 standard drinks    Types: 1 Glasses of wine, 1 Cans of beer, 1 Shots of liquor per week  . Drug use: No  . Sexual activity: Not on file  Other Topics Concern  . Not on file  Social History Narrative  . Not on file   Social Determinants of Health   Financial Resource Strain:   . Difficulty of Paying Living Expenses: Not on file  Food Insecurity:   . Worried About Charity fundraiser in the Last Year: Not on file  . Ran Out of Food in the Last Year: Not on file  Transportation Needs:   . Lack of Transportation (Medical): Not on file  . Lack of Transportation (Non-Medical): Not on file  Physical Activity:   . Days of Exercise per  Week: Not on file  . Minutes of Exercise per Session: Not on file  Stress:   . Feeling of Stress : Not on file  Social Connections:   . Frequency of Communication with Friends and Family: Not on file  . Frequency of Social Gatherings with Friends and Family: Not on file  . Attends Religious Services: Not on file  . Active Member of Clubs or Organizations: Not on file  . Attends Banker Meetings: Not on file  . Marital Status: Not on file     Family History: The patient's family history includes Heart attack in his paternal grandmother; Hypertension in his father.  ROS:   Please see the history of present illness.     All other systems reviewed and are negative.  EKGs/Labs/Other Studies Reviewed:    The following studies were reviewed today:   EKG:  EKG is  ordered today.   The ekg ordered today demonstrates normal sinus rhythm, normal ECG.  Recent Labs: 03/14/2019: BUN 15; Creatinine, Ser 0.84; Hemoglobin 14.1; Platelets 329; Potassium 4.2; Sodium 139  Recent Lipid Panel No results found for: CHOL, TRIG, HDL, CHOLHDL, VLDL, LDLCALC, LDLDIRECT  Physical Exam:    VS:  BP 123/80 (BP Location: Right Arm, Patient Position: Sitting, Cuff Size: Large)   Pulse 61   Ht 5\' 10"  (1.778 m)   Wt 295 lb 8 oz (134 kg)   SpO2 98%   BMI 42.40 kg/m     Wt Readings from Last 3 Encounters:  08/30/19 295 lb 8 oz (134 kg)  03/13/19 288 lb (130.6 kg)  12/23/17 280 lb (127 kg)     GEN:  Well nourished, well developed in no acute distress, obese HEENT: Normal NECK: No JVD; No carotid bruits LYMPHATICS: No lymphadenopathy CARDIAC: RRR, no murmurs, rubs, gallops RESPIRATORY:  Clear to auscultation without rales, wheezing or rhonchi  ABDOMEN: Soft, non-tender, non-distended MUSCULOSKELETAL:  No edema; No deformity  SKIN: Warm and dry NEUROLOGIC:  Alert and oriented x 3 PSYCHIATRIC:  Normal affect   ASSESSMENT:    1. Gastroesophageal reflux disease with esophagitis without hemorrhage   2. Morbid obesity (HCC)   3. Smoking   4. Anxiety    PLAN:    In order of problems listed above:  Patient symptoms of increased heart rate and likely secondary to anxiety.  He has no risk factors of hypertension, hyperlipidemia, diabetes.  He is very young and symptoms and not cardiac related from patient history and physical so far.  I do not believe there is any indication for cardiac testing at this point.  1. Patient complains of acid reflux symptoms at occasionally triggers his anxiety.  Management of acid reflux might help reduce his anxiety symptoms.  Commended over-the-counter Prilosec 20 mg 1 tab daily.  He can follow-up with his primary care provider if symptoms persist. 2. I counseled patient on low calorie diet and exercise to help with weight loss.  Over 20 minutes spent for  this.  Nutritionist referral can be obtained through his primary care provider. 3. Smoking cessation advised.  Over 5 minutes spent.  Patient was educated on the harmful effects of e-cigarettes. 4. Patient states having symptoms of anxiety for 10 to 15 years.  I believe a psych or behavioral health referral will be important to manage patient's symptoms.  His primary care provider if comfortable with management of anxiety can also perform this.  Follow-up as needed    This note was generated in part or whole  with voice recognition software. Voice recognition is usually quite accurate but there are transcription errors that can and very often do occur. I apologize for any typographical errors that were not detected and corrected.  Medication Adjustments/Labs and Tests Ordered: Current medicines are reviewed at length with the patient today.  Concerns regarding medicines are outlined above.  Orders Placed This Encounter  Procedures  . EKG 12-Lead   No orders of the defined types were placed in this encounter.   Patient Instructions  Medication Instructions:  - Your physician has recommended you make the following change in your medication:   1) You may take prilosec over the counter: take 1 tablet (20 mg) by mouth once daily   *If you need a refill on your cardiac medications before your next appointment, please call your pharmacy*  Lab Work: - none ordered  If you have labs (blood work) drawn today and your tests are completely normal, you will receive your results only by: Marland Kitchen MyChart Message (if you have MyChart) OR . A paper copy in the mail If you have any lab test that is abnormal or we need to change your treatment, we will call you to review the results.  Testing/Procedures: - none ordered  Follow-Up: At [facility], you and your health needs are our priority.  As part of our continuing mission to provide you with exceptional heart care, we have created designated  Provider Care Teams.  These Care Teams include your primary Cardiologist (physician) and Advanced Practice Providers (APPs -  Physician Assistants and Nurse Practitioners) who all work together to provide you with the care you need, when you need it.  Your next appointment:    as needed  The format for your next appointment:   n/a  Provider:   Debbe Odea, MD  Other Instructions Follow up with your Primary Care Doctor     Signed, Debbe Odea, MD  08/30/2019 3:44 PM    [facility]

## 2020-12-04 ENCOUNTER — Other Ambulatory Visit: Payer: Self-pay

## 2020-12-04 ENCOUNTER — Emergency Department
Admission: EM | Admit: 2020-12-04 | Discharge: 2020-12-04 | Disposition: A | Discharge status: Home / Self Care | Payer: No Typology Code available for payment source | Attending: Emergency Medicine | Admitting: Emergency Medicine

## 2020-12-04 ENCOUNTER — Emergency Department: Payer: No Typology Code available for payment source

## 2020-12-04 DIAGNOSIS — T17920A Food in respiratory tract, part unspecified causing asphyxiation, initial encounter: Secondary | ICD-10-CM | POA: Diagnosis present

## 2020-12-04 DIAGNOSIS — X58XXXA Exposure to other specified factors, initial encounter: Secondary | ICD-10-CM | POA: Diagnosis not present

## 2020-12-04 DIAGNOSIS — T17320A Food in larynx causing asphyxiation, initial encounter: Secondary | ICD-10-CM | POA: Diagnosis not present

## 2020-12-04 DIAGNOSIS — F17210 Nicotine dependence, cigarettes, uncomplicated: Secondary | ICD-10-CM | POA: Insufficient documentation

## 2020-12-04 LAB — CBC WITH DIFFERENTIAL/PLATELET
Abs Immature Granulocytes: 0.05 10*3/uL (ref 0.00–0.07)
Basophils Absolute: 0 10*3/uL (ref 0.0–0.1)
Basophils Relative: 0 %
Eosinophils Absolute: 0.2 10*3/uL (ref 0.0–0.5)
Eosinophils Relative: 2 %
HCT: 43.9 % (ref 39.0–52.0)
Hemoglobin: 14.8 g/dL (ref 13.0–17.0)
Immature Granulocytes: 0 %
Lymphocytes Relative: 27 %
Lymphs Abs: 3 10*3/uL (ref 0.7–4.0)
MCH: 27.9 pg (ref 26.0–34.0)
MCHC: 33.7 g/dL (ref 30.0–36.0)
MCV: 82.7 fL (ref 80.0–100.0)
Monocytes Absolute: 0.7 10*3/uL (ref 0.1–1.0)
Monocytes Relative: 6 %
Neutro Abs: 7.3 10*3/uL (ref 1.7–7.7)
Neutrophils Relative %: 65 %
Platelets: 336 10*3/uL (ref 150–400)
RBC: 5.31 MIL/uL (ref 4.22–5.81)
RDW: 12.6 % (ref 11.5–15.5)
WBC: 11.3 10*3/uL — ABNORMAL HIGH (ref 4.0–10.5)
nRBC: 0 % (ref 0.0–0.2)

## 2020-12-04 LAB — BASIC METABOLIC PANEL
Anion gap: 9 (ref 5–15)
BUN: 15 mg/dL (ref 6–20)
CO2: 23 mmol/L (ref 22–32)
Calcium: 8.9 mg/dL (ref 8.9–10.3)
Chloride: 104 mmol/L (ref 98–111)
Creatinine, Ser: 0.83 mg/dL (ref 0.61–1.24)
GFR, Estimated: >60 mL/min (ref >60)
Glucose, Bld: 99 mg/dL (ref 70–99)
Potassium: 3.9 mmol/L (ref 3.5–5.1)
Sodium: 136 mmol/L (ref 135–145)

## 2020-12-04 MED ORDER — MENTHOL 3 MG MT LOZG
1.0000 | LOZENGE | OROMUCOSAL | Status: DC | PRN
  Filled 2020-12-04: qty 9

## 2020-12-04 MED ORDER — LIDOCAINE VISCOUS HCL 2 % MT SOLN
15.0000 mL | Freq: Once | OROMUCOSAL | Status: AC
  Administered 2020-12-04: 15 mL via OROMUCOSAL
  Filled 2020-12-04: qty 15

## 2020-12-04 NOTE — ED Provider Notes (Signed)
[facility] Emergency Department Provider Note  ____________________________________________   Event Date/Time   First MD Initiated Contact with Patient 12/04/20 1915     (approximate)  I have reviewed the triage vital signs and the nursing notes.   HISTORY  Chief Complaint Oral Swelling  HPI Jonathan Lam is a 40 y.o. male presents to the ED accompanied by his wife, for evaluation of a coughing/choking spell.  Patient describes onset about half an hour after he and his wife finished a dinnertime meal.  Patient apparently had a garden salad with grilled chicken.  He denies any choking, gagging, or emesis related to his meal.  He denies any difficulty breathing, swallowing, or controlling oral secretions at this time.  He also denies any concern for anaphylaxis.  Patient reports no food or drug allergies, but does endorse anaphylactic reaction to bee/yellowjacket venom.  He presents at this time due to ongoing dryness and irritation to the throat.  Patient describes a sensation of dryness but denies any ongoing or current symptoms of swelling, fullness, or edema.  He denies any cough induced emesis, reflux, or waterbrash.  She also denies any chest pain, shortness of breath, or syncope.     History reviewed. No pertinent past medical history.  Patient Active Problem List   Diagnosis Date Noted  . 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 Palpitations 02/26/2011    Past Surgical History:  Procedure Laterality Date  . LEG SURGERY Right     Prior to Admission medications   Medication Sig Start Date End Date Taking? Authorizing Provider  Cyanocobalamin (B-12 PO) Take by mouth daily.    [provider]  EPINEPHrine 0.3 mg/0.3 mL IJ SOAJ injection Inject 0.3 mLs (0.3 mg total) into the muscle as needed for anaphylaxis. Can substitute generic (or cheapest) 03/14/19   Alford Highland, MD  ergocalciferol (VITAMIN D2) 1.25 MG (50000 UT) capsule  Take 50,000 Units by mouth once a week.    [provider]    Allergies Yellow jacket venom [bee venom]  Family History  Problem Relation Age of Onset  . Hypertension Father   . Heart attack Paternal Grandmother     Social History Social History   Tobacco Use  . Smoking status: Current Every Day Smoker    Packs/day: 1.50    Years: 20.00    Pack years: 30.00    Types: Cigarettes    Last attempt to quit: 02/20/2011    Years since quitting: 9.7  . Smokeless tobacco: Never Used  Substance Use Topics  . Alcohol use: Not Currently    Alcohol/week: 3.0 standard drinks    Types: 1 Glasses of wine, 1 Cans of beer, 1 Shots of liquor per week  . Drug use: No    Review of Systems  Constitutional: No fever/chills Eyes: No visual changes. ENT: Reports sore throat as above. Cardiovascular: Denies chest pain. Respiratory: Denies shortness of breath. Gastrointestinal: No abdominal pain.  No nausea, no vomiting.  No diarrhea.  No constipation. Genitourinary: Negative for dysuria. Musculoskeletal: Negative for back pain. Skin: Negative for rash. Neurological: Negative for headaches, focal weakness or numbness. ____________________________________________   PHYSICAL EXAM:  VITAL SIGNS: ED Triage Vitals [12/04/20 1852]  Enc Vitals Group     BP (!) 143/73     Pulse Rate 77     Resp 18     Temp 98.2 F (36.8 C)     Temp Source Oral     SpO2 100 %  Weight 280 lb (127 kg)     Height 5\' 10"  (1.778 m)     Head Circumference      Peak Flow      Pain Score 0     Pain Loc      Pain Edu?      Excl. in GC?     Constitutional: Alert and oriented. Well appearing and in no acute distress. Eyes: Conjunctivae are normal. PERRL. EOMI. Head: Atraumatic. Nose: No congestion/rhinnorhea. Mouth/Throat: Mucous membranes are moist.  Oropharynx non-erythematous.  Uvula is midline and tonsils are flat.  No edema of the uvula noted.  No oropharyngeal lesions are appreciated.   Controlling oral secretions or difficulty.  Normal voice and phonation at this time. Neck: No stridor.   Cardiovascular: Normal rate, regular rhythm. Grossly normal heart sounds.  Good peripheral circulation. Respiratory: Normal respiratory effort.  No retractions. Lungs CTAB.  Remittent cough appreciated. Gastrointestinal: Soft and nontender. No distention. No abdominal bruits. No CVA tenderness. Musculoskeletal: No lower extremity tenderness nor edema.  No joint effusions. Neurologic:  Normal speech and language. No gross focal neurologic deficits are appreciated. No gait instability. Skin:  Skin is warm, dry and intact. No rash noted. Psychiatric: Mood and affect are normal. Speech and behavior are normal. ____________________________________________   LABS (all labs ordered are listed, but only abnormal results are displayed)  Labs Reviewed  CBC WITH DIFFERENTIAL/PLATELET - Abnormal; Notable for the following components:      Result Value   WBC 11.3 (*)    All other components within normal limits  BASIC METABOLIC PANEL   ____________________________________________  EKG  ____________________________________________  RADIOLOGY I, , personally viewed and evaluated these images (plain radiographs) as part of my medical decision making, as well as reviewing the written report by the radiologist.  ED MD interpretation:  Agree with report  Official radiology report(s): DG Neck Soft Tissue  Result Date: 12/04/2020 CLINICAL DATA:  40 year old male with cough. EXAM: NECK SOFT TISSUES - 1+ VIEW COMPARISON:  None. FINDINGS: There is no evidence of retropharyngeal soft tissue swelling or epiglottic enlargement. The cervical airway is unremarkable and no radio-opaque foreign body identified. IMPRESSION: Negative. Electronically Signed   By: 20 M.D.   On: 12/04/2020 19:41   DG Chest 2 View  Result Date: 12/04/2020 CLINICAL DATA:  40 year old male with  cough. EXAM: CHEST - 2 VIEW COMPARISON:  Chest radiograph dated 02/19/2011 FINDINGS: No focal consolidation, pleural effusion, or pneumothorax. The cardiac silhouette is within limits. No acute osseous pathology. IMPRESSION: No active cardiopulmonary disease. Electronically Signed   By: 04/22/2011 M.D.   On: 12/04/2020 19:40    ____________________________________________   PROCEDURES  Procedure(s) performed (including Critical Care):  Procedures  Lidocaine viscous 2%  ____________________________________________   INITIAL IMPRESSION / ASSESSMENT AND PLAN / ED COURSE  As part of my medical decision making, I reviewed the following data within the electronic MEDICAL RECORD NUMBER History obtained from family, Labs reviewed WNL, Radiograph reviewed NAD and Notes from prior ED visits    DDX: anaphylaxis, angioedema, reflux, regurgitation, esophogeal foreign body  Patient ED evaluation of coughing and choking following a dinnertime meal.  Patient presented with some ongoing coughing and sore throat irritation.  No concerning presentation for any angioedema or anaphylaxis.  Patient is drooling or secretions at this time, and able speak in complete sentences.  No airway compromise or respiratory stress is noted. ----------------------------------------- 8:51 PM on 12/04/2020 ----------------------------------------- Patient with improved pain and no complaints  at this time.  Stable for discharge without worsening of his condition.  Follow-up with primary provider or return to the ED if needed. ____________________________________________   FINAL CLINICAL IMPRESSION(S) / ED DIAGNOSES  Final diagnoses:  Choking due to food in larynx, initial encounter     ED Discharge Orders    None      *Please note:  Jonathan Lam was evaluated in Emergency Department on 12/04/2020 for the symptoms described in the history of present illness. He was evaluated in the context of the global  COVID-19 pandemic, which necessitated consideration that the patient might be at risk for infection with the SARS-CoV-2 virus that causes COVID-19. Institutional protocols and algorithms that pertain to the evaluation of patients at risk for COVID-19 are in a state of rapid change based on information released by regulatory bodies including the CDC and federal and state organizations. These policies and algorithms were followed during the patient's care in the ED.  Some ED evaluations and interventions may be delayed as a result of limited staffing during and the pandemic.*   Note:  This document was prepared using Dragon voice recognition software and may include unintentional dictation errors.    Lissa Hoard, PA-C 12/04/20 2052    Concha Se, MD 12/04/20 2206

## 2020-12-04 NOTE — Discharge Instructions (Addendum)
Your exam and labs are normal and reassuring at this time.  No x-ray evidence of any pneumonia by aspiration, or any retained foreign body in throat.  Drink plenty of fluids and eat small portions that you chew well.  Use throat lozenges as needed for discomfort.  Follow-up with primary provider return to ED if needed.

## 2020-12-04 NOTE — ED Triage Notes (Signed)
Pt states he ate dinner and about after leaving began having itchy throat like something it stuck or swelling in there. Pt lip or tongue swelling noted, no known food allergies

## 2020-12-04 NOTE — ED Notes (Signed)
This RN message pharmacy about pt med needing to be tubed

## 2020-12-04 NOTE — ED Triage Notes (Signed)
C/O coughing and difficulty breathing x 1 hour.  AAOx3.  Skin warm and dry. NAD

## 2021-12-21 IMAGING — CR DG NECK SOFT TISSUE
2 series · 2 of 2 positions shown · non-contrast
Comparison: None.

CLINICAL DATA: 39-year-old male with cough.

EXAM:
NECK SOFT TISSUES - 1+ VIEW

[neck lat]
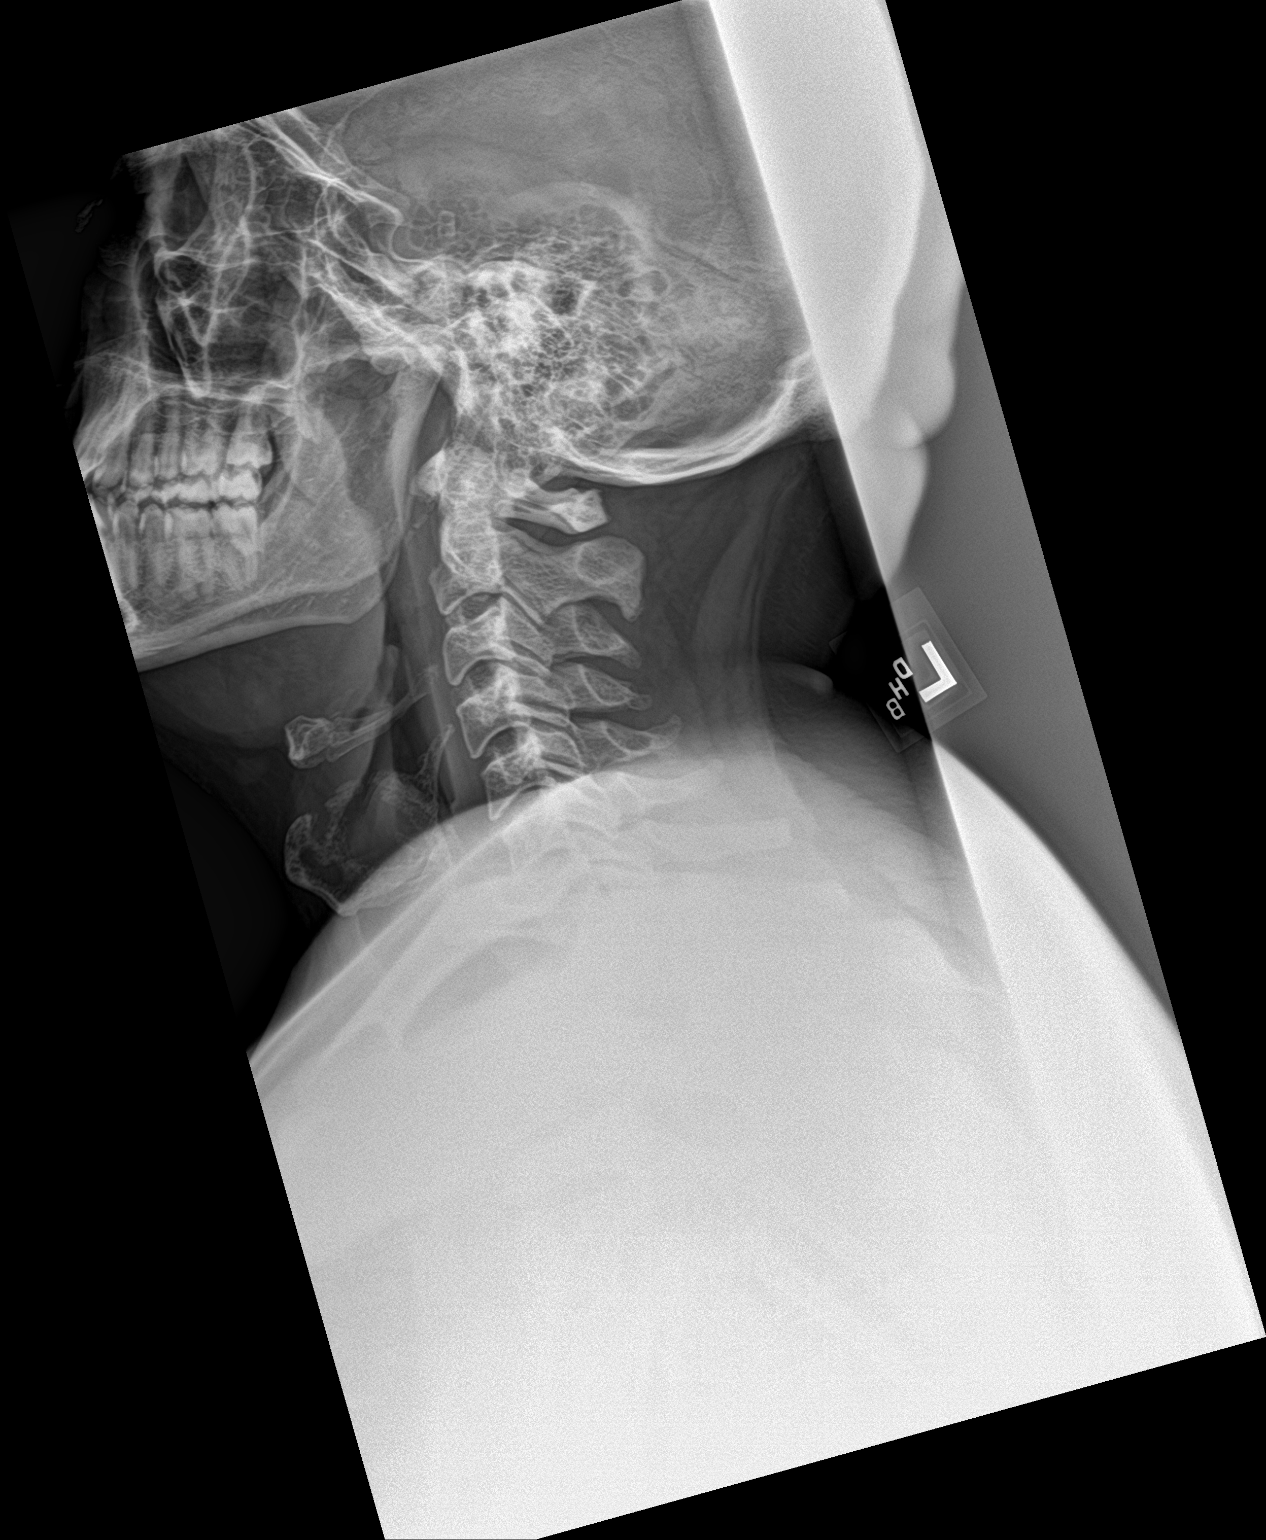

[neck ap]
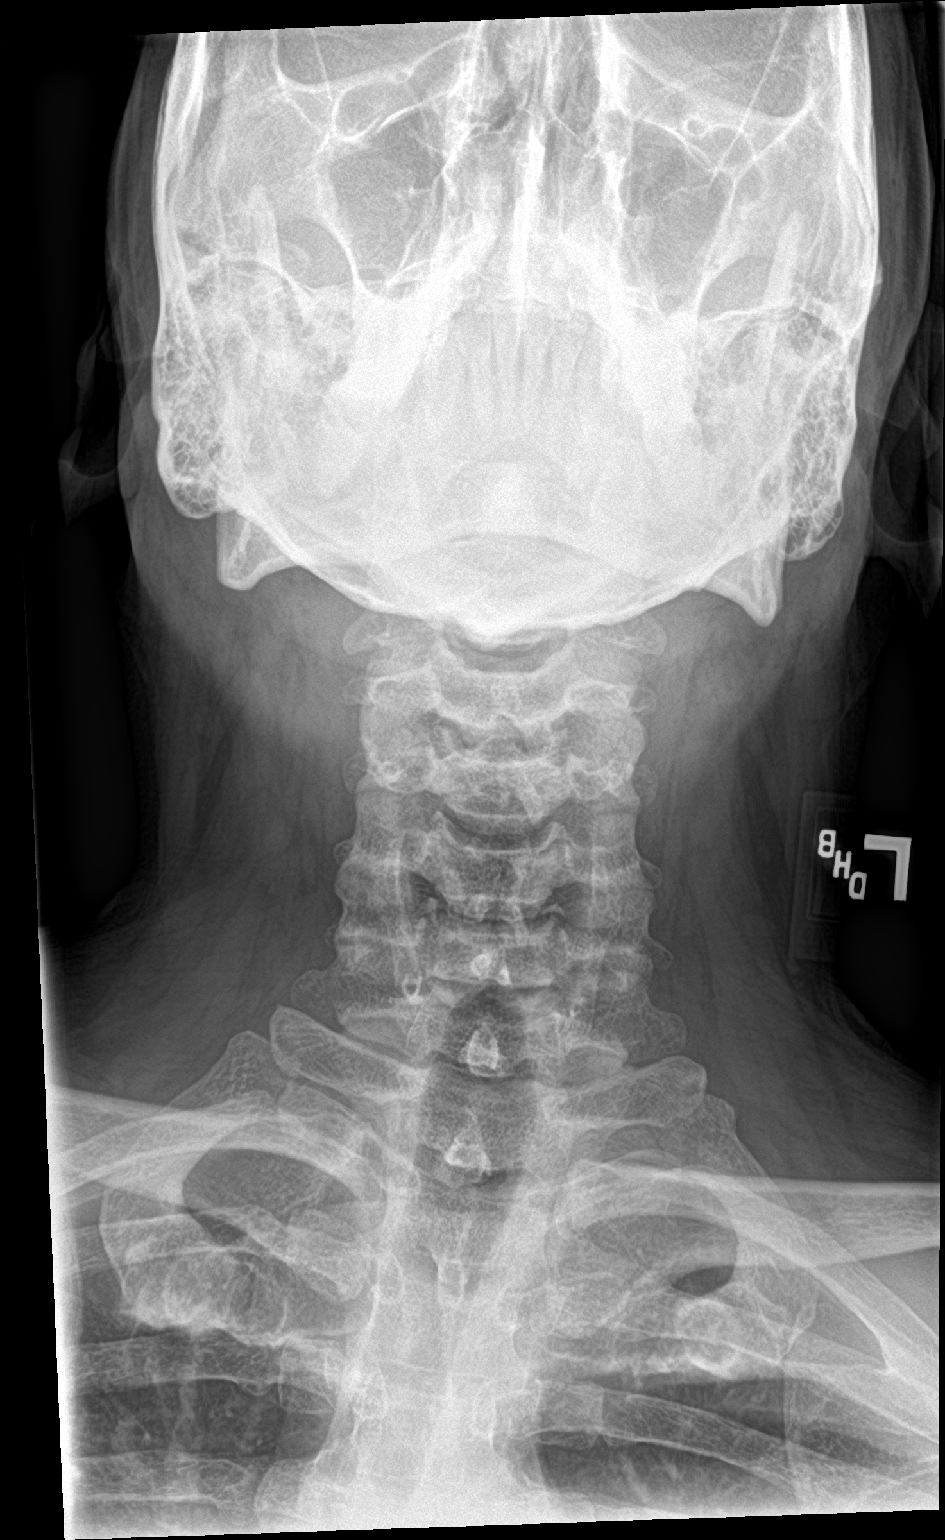

[2 of 2 positions shown; findings below may reference images not displayed]

FINDINGS: There is no evidence of retropharyngeal soft tissue swelling or
epiglottic enlargement. The cervical airway is unremarkable and no
radio-opaque foreign body identified.
IMPRESSION: Negative.

## 2021-12-21 IMAGING — CR DG CHEST 2V
2 series · 2 of 2 positions shown · non-contrast
Comparison: Chest radiograph dated 02/19/2011

CLINICAL DATA: 39-year-old male with cough.

EXAM:
CHEST - 2 VIEW

[chest pa]
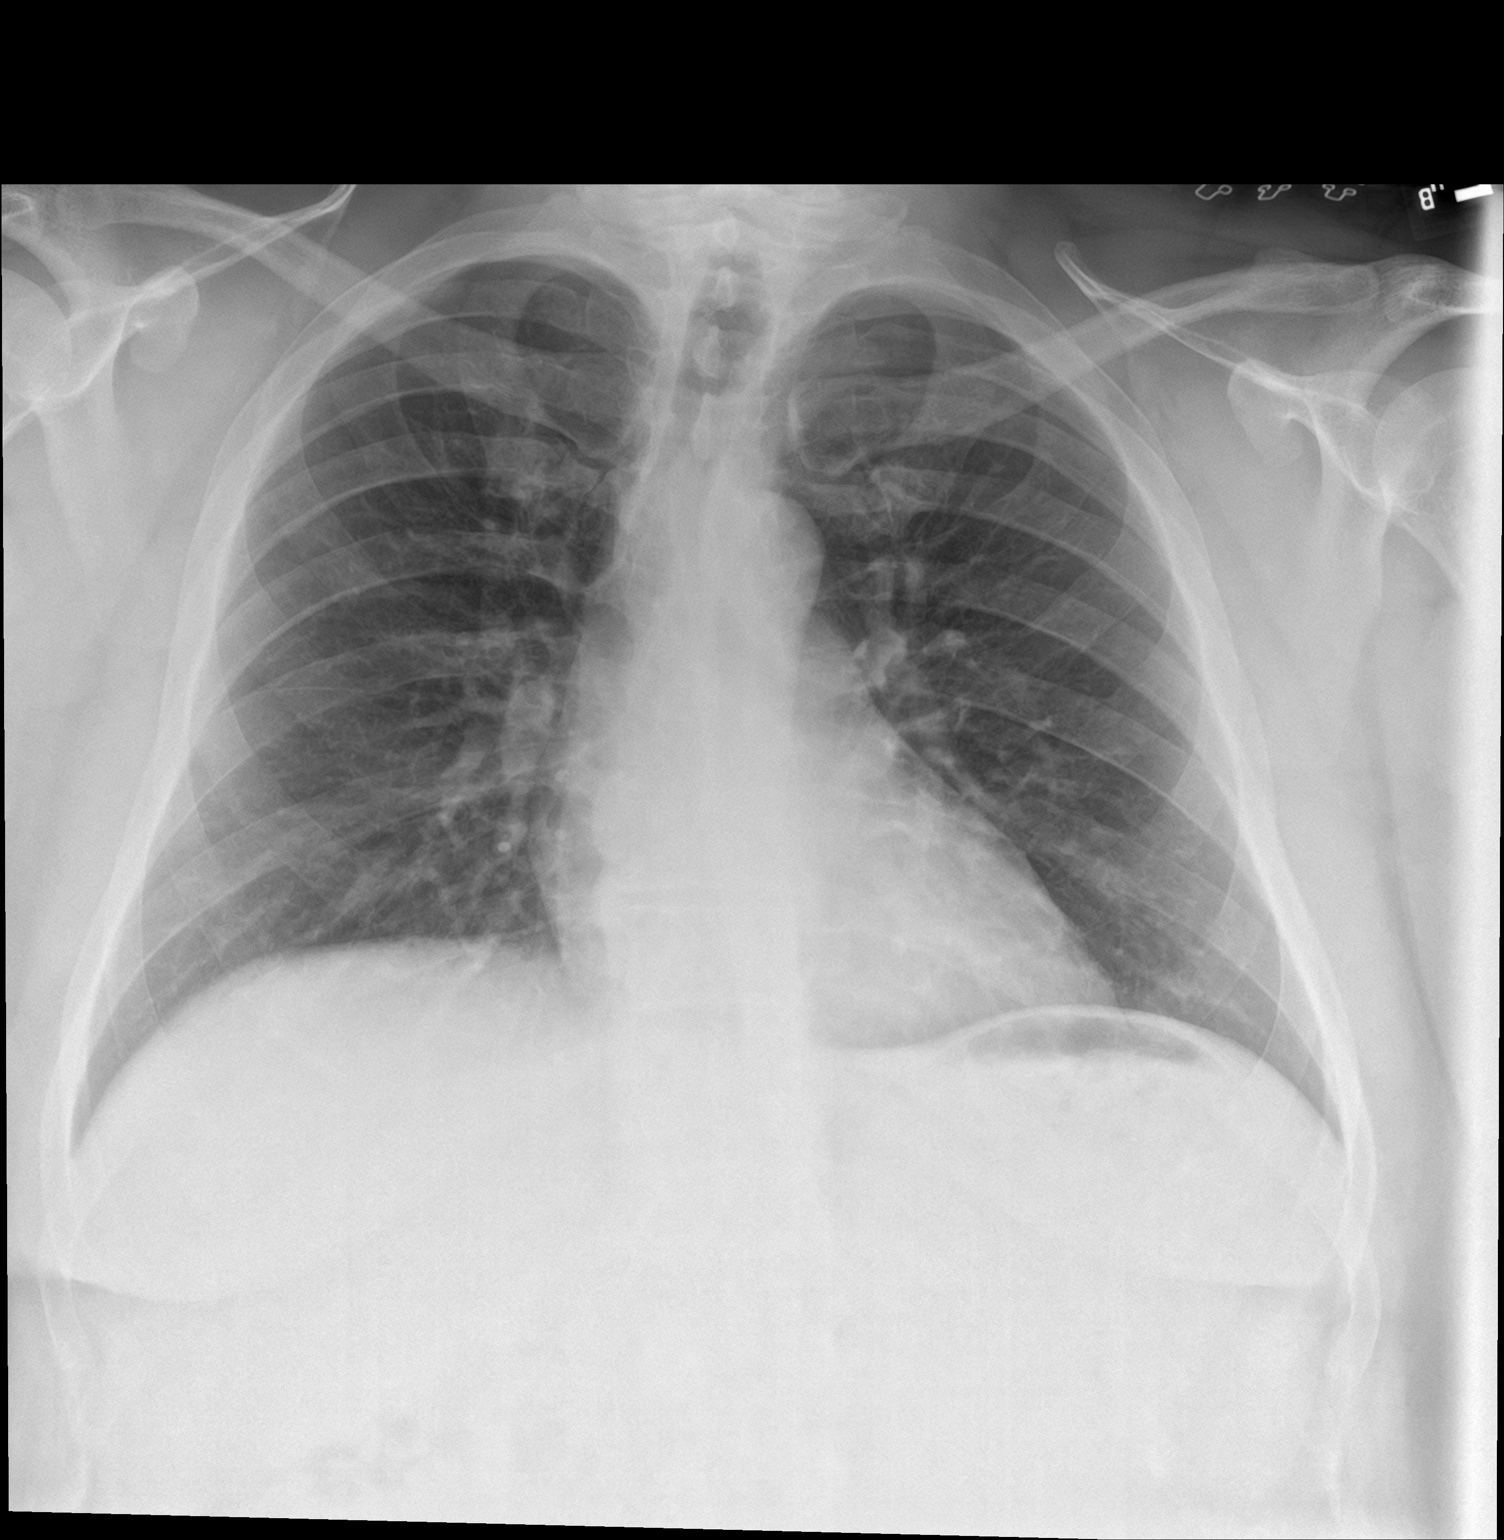

[chest lat]
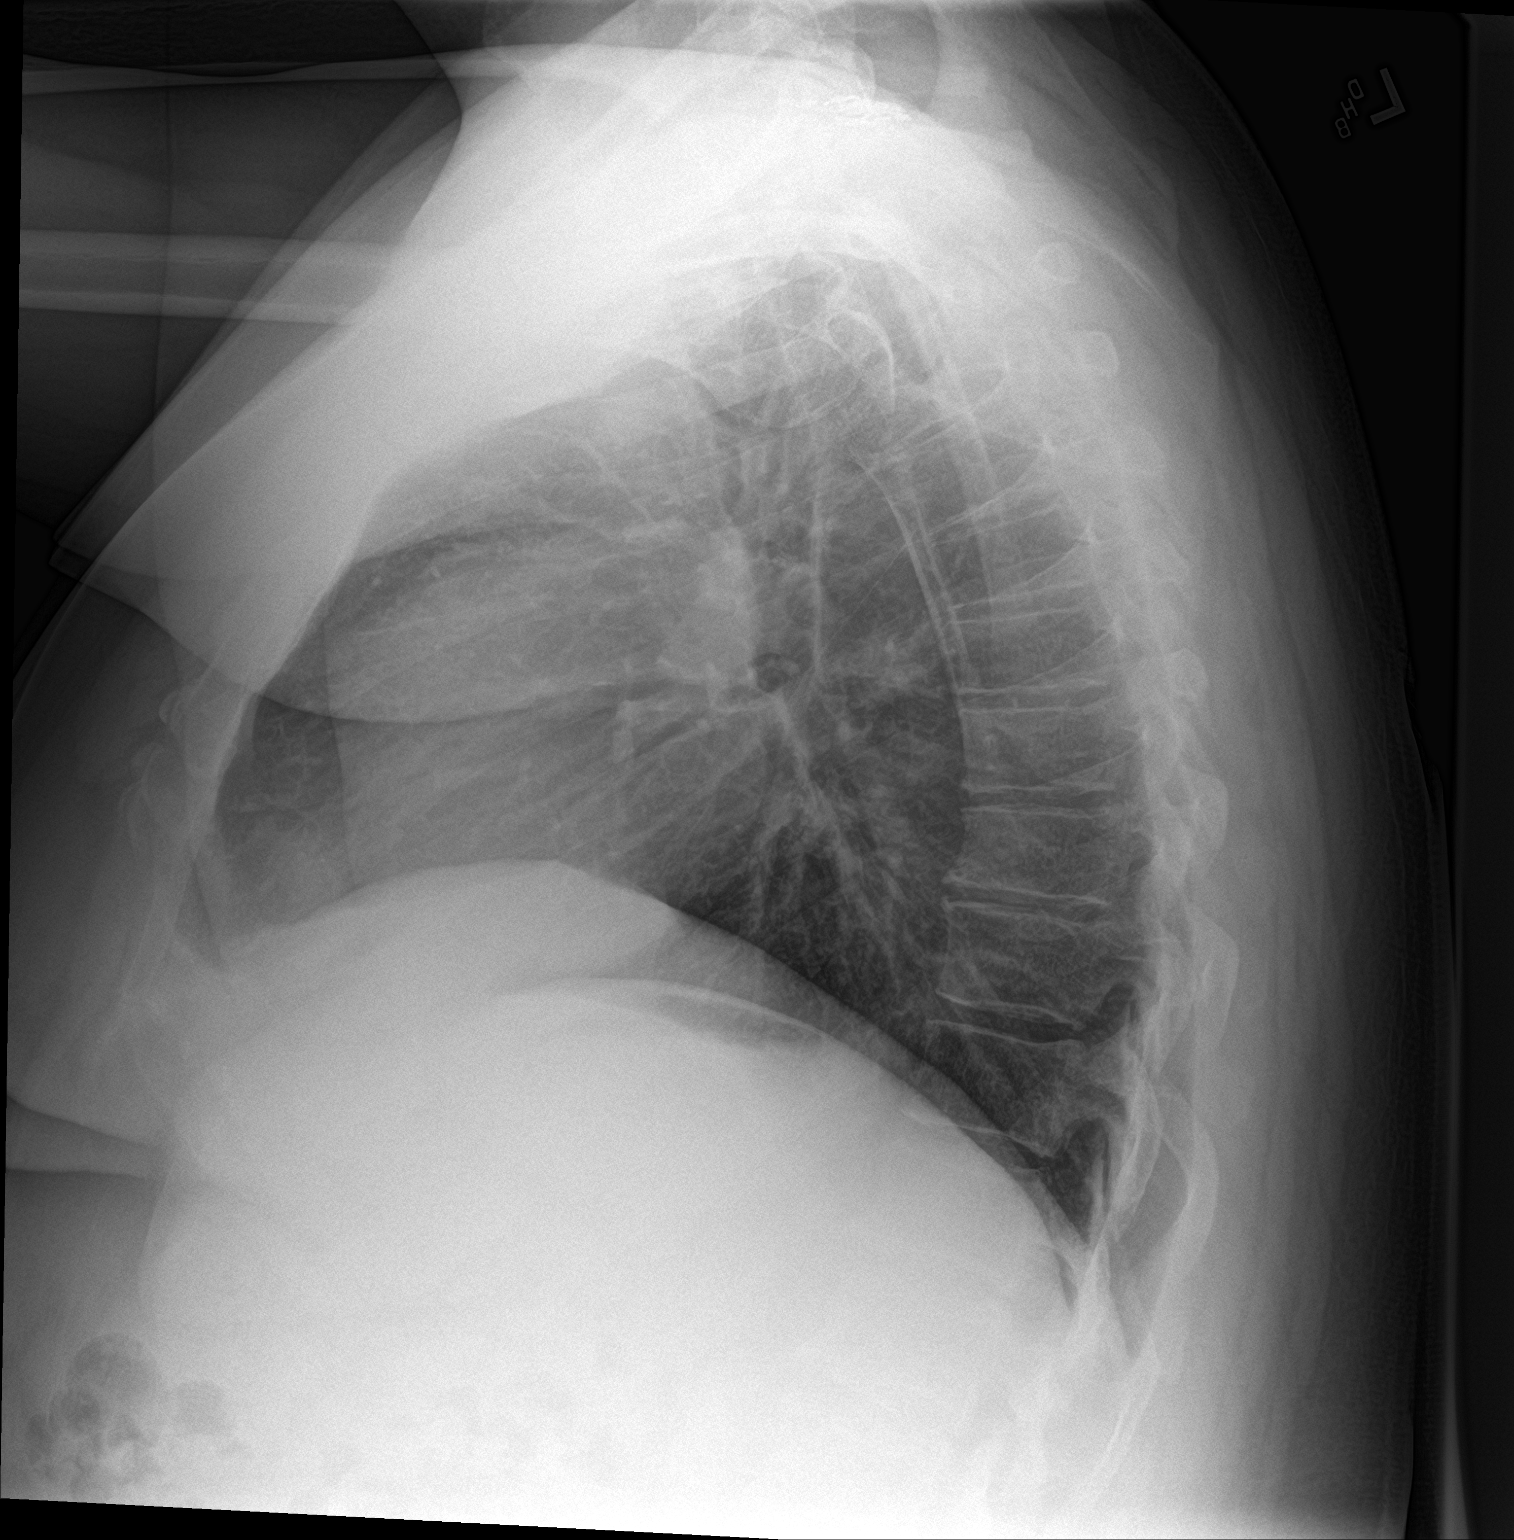

[2 of 2 positions shown; findings below may reference images not displayed]

FINDINGS: No focal consolidation, pleural effusion, or pneumothorax. The
cardiac silhouette is within limits. No acute osseous pathology.
IMPRESSION: No active cardiopulmonary disease.

## 2022-07-30 ENCOUNTER — Ambulatory Visit: Payer: No Typology Code available for payment source | Admitting: Dermatology

## 2022-10-20 NOTE — Progress Notes (Signed)
No show for appointment. Office will call to reschedule.  

## 2022-10-21 ENCOUNTER — Ambulatory Visit: Payer: 59 | Admitting: Internal Medicine

## 2022-10-21 DIAGNOSIS — Z91199 Patient's noncompliance with other medical treatment and regimen due to unspecified reason: Secondary | ICD-10-CM

## 2023-04-23 ENCOUNTER — Encounter (HOSPITAL_COMMUNITY): Payer: Self-pay | Admitting: Emergency Medicine

## 2023-04-23 ENCOUNTER — Emergency Department (HOSPITAL_COMMUNITY)
Admission: EM | Admit: 2023-04-23 | Discharge: 2023-04-23 | Disposition: A | Discharge status: Home / Self Care | Payer: 59 | Attending: Emergency Medicine | Admitting: Emergency Medicine

## 2023-04-23 DIAGNOSIS — R42 Dizziness and giddiness: Secondary | ICD-10-CM | POA: Diagnosis not present

## 2023-04-23 DIAGNOSIS — R55 Syncope and collapse: Secondary | ICD-10-CM | POA: Insufficient documentation

## 2023-04-23 LAB — BASIC METABOLIC PANEL
Anion gap: 5 (ref 5–15)
BUN: 10 mg/dL (ref 6–20)
CO2: 24 mmol/L (ref 22–32)
Calcium: 8 mg/dL — ABNORMAL LOW (ref 8.9–10.3)
Chloride: 109 mmol/L (ref 98–111)
Creatinine, Ser: 0.69 mg/dL (ref 0.61–1.24)
GFR, Estimated: >60 mL/min (ref >60)
Glucose, Bld: 88 mg/dL (ref 70–99)
Potassium: 3.6 mmol/L (ref 3.5–5.1)
Sodium: 138 mmol/L (ref 135–145)

## 2023-04-23 LAB — CBC WITH DIFFERENTIAL/PLATELET
Abs Immature Granulocytes: 0.02 10*3/uL (ref 0.00–0.07)
Basophils Absolute: 0 10*3/uL (ref 0.0–0.1)
Basophils Relative: 0 %
Eosinophils Absolute: 0.1 10*3/uL (ref 0.0–0.5)
Eosinophils Relative: 1 %
HCT: 43.1 % (ref 39.0–52.0)
Hemoglobin: 13.8 g/dL (ref 13.0–17.0)
Immature Granulocytes: 0 %
Lymphocytes Relative: 19 %
Lymphs Abs: 1.3 10*3/uL (ref 0.7–4.0)
MCH: 26.3 pg (ref 26.0–34.0)
MCHC: 32 g/dL (ref 30.0–36.0)
MCV: 82.3 fL (ref 80.0–100.0)
Monocytes Absolute: 0.5 10*3/uL (ref 0.1–1.0)
Monocytes Relative: 7 %
Neutro Abs: 5.1 10*3/uL (ref 1.7–7.7)
Neutrophils Relative %: 73 %
Platelets: 323 10*3/uL (ref 150–400)
RBC: 5.24 MIL/uL (ref 4.22–5.81)
RDW: 12.9 % (ref 11.5–15.5)
WBC: 7.1 10*3/uL (ref 4.0–10.5)
nRBC: 0 % (ref 0.0–0.2)

## 2023-04-23 MED ORDER — LACTATED RINGERS IV BOLUS
1000.0000 mL | Freq: Once | INTRAVENOUS | Status: AC
  Administered 2023-04-23: 1000 mL via INTRAVENOUS

## 2023-04-23 NOTE — ED Provider Notes (Signed)
[place] EMERGENCY DEPARTMENT AT [facility] Provider Note   CSN: [id number] Arrival date & time: 04/23/23  1024     History  Chief Complaint  Patient presents with   Near Syncope    Jonathan Lam is a 42 y.o. male.  42 year old male presents today for concern of near syncopal episode.  He states he was in the meeting when he suddenly had a sensation of lightheadedness that lasted for a little while.  Does not describe this as the room spinning.  He states he stood up notified his colleagues that he was not feeling well.  At that point he states his anxiety set in and he felt a sensation of warmth come over him.  Denies nausea, vomiting, diaphoresis, chest pain, shortness of breath.  He states he does suffer from anxiety however there was no anxiety preceding the event.  Does endorse decreased fluid intake recently.  No history of syncopal episodes in the past.  Did not have syncopal episode today but states he felt like he was going to pass out.  He had another episode in a row.  He states he was just laying on the stretcher when all of a sudden he had the sensation lightheadedness.  No associated palpitations, diaphoresis.  The history is provided by the patient. No language interpreter was used.       Home Medications Prior to Admission medications   Medication Sig Start Date End Date Taking? Authorizing Provider  Cyanocobalamin (B-12 PO) Take by mouth daily.    [provider]  EPINEPHrine 0.3 mg/0.3 mL IJ SOAJ injection Inject 0.3 mLs (0.3 mg total) into the muscle as needed for anaphylaxis. Can substitute generic (or cheapest) 03/14/19   Alford Highland, MD  ergocalciferol (VITAMIN D2) 1.25 MG (50000 UT) capsule Take 50,000 Units by mouth once a week.    [provider]      Allergies    Yellow jacket venom [bee venom]    Review of Systems   Review of Systems  Constitutional:  Negative for diaphoresis and fever.  Respiratory:  Negative for  shortness of breath.   Cardiovascular:  Negative for chest pain and palpitations.  Gastrointestinal:  Negative for nausea.  Neurological:  Positive for light-headedness.  All other systems reviewed and are negative.   Physical Exam Updated Vital Signs BP (!) 125/45   Pulse 65   Temp 98.8 F (37.1 C) (Oral)   Resp 12   SpO2 100%  Physical Exam Vitals and nursing note reviewed.  Constitutional:      General: He is not in acute distress.    Appearance: Normal appearance. He is not ill-appearing.  HENT:     Head: Normocephalic and atraumatic.     Nose: Nose normal.  Eyes:     General: No scleral icterus.    Extraocular Movements: Extraocular movements intact.     Conjunctiva/sclera: Conjunctivae normal.  Cardiovascular:     Rate and Rhythm: Normal rate and regular rhythm.     Heart sounds: Normal heart sounds.  Pulmonary:     Effort: Pulmonary effort is normal. No respiratory distress.     Breath sounds: Normal breath sounds. No wheezing or rales.  Musculoskeletal:        General: Normal range of motion.     Cervical back: Normal range of motion.  Skin:    General: Skin is warm and dry.  Neurological:     General: No focal deficit present.     Mental Status:  He is alert. Mental status is at baseline.     ED Results / Procedures / Treatments   Labs (all labs ordered are listed, but only abnormal results are displayed) Labs Reviewed - No data to display  EKG EKG Interpretation Date/Time:  Thursday April 23 2023 10:33:41 EDT Ventricular Rate:  65 PR Interval:  174 QRS Duration:  95 QT Interval:  400 QTC Calculation: 416 R Axis:   15  Text Interpretation: Sinus rhythm Abnormal R-wave progression, early transition Borderline ST elevation, lateral leads No significant change since last tracing Confirmed by Jacalyn Lefevre [phone]) on 04/23/2023 10:41:57 AM  Radiology No results found.  Procedures Procedures    Medications Ordered in ED Medications - No  data to display  ED Course/ Medical Decision Making/ A&P                                 Medical Decision Making Amount and/or Complexity of Data Reviewed Labs: ordered.   42 year old male presents today for concern of near syncopal episode.  Without associated chest pain, shortness of breath, diaphoresis.  Occurred twice.  1 en route with EMS.  Overall well-appearing.  No significant cardiac medical history.  Does endorse decreased p.o. hydration.  No history of syncopal episodes.  Currently asymptomatic.  Orthostatic negative at bedside performed by me.  Will obtain labs, provide fluid, and maintain telemetry monitoring.  Main asymptomatic.  Telemetry without concerning rhythms.  Reports he has history of palpitations.  Never been told he has A-fib or a flutter or other concerning rhythms.  No palpitations during this episode.  Will give cardiology referral.  Patient appropriate for discharge.  Discharged in stable condition.  Return precautions discussed.   Final Clinical Impression(s) / ED Diagnoses Final diagnoses:  Near syncope    Rx / DC Orders ED Discharge Orders          Ordered    Ambulatory referral to Cardiology       Comments: If you have not heard from the Cardiology office within the next 72 hours please call [phone].   04/23/23 1327              Marita Kansas, PA-C 04/23/23 1327    Jacalyn Lefevre, MD 04/23/23 1437

## 2023-04-23 NOTE — ED Triage Notes (Signed)
Pt BIB [facility] from work at [employer]. Pt was in a meeting and suddenly became dizzy, weak, and lightheaded. Pt experiences anxiety attacks but didn't feel anxious when this happened.  PT had a second episode on the truck with no diaphoresis or significant change in VS.  Pt recently started Adderall but has had no issues.  Pt does endorse that he doesn't drink enough water. Pt did eat breakfast. EMS endorses negative stroke screen and unremarkable 12-lead.  Pt is A&O x4.  128/80 98% HR 70, RR 16 CBG 97

## 2023-04-23 NOTE — Discharge Instructions (Signed)
Your workup today was reassuring.  No concerning causes of your symptoms.  Follow-up with your primary care provider.  I have given you cardiology referral given you have history of palpitations.  For any concerning symptoms return to the emergency room.

## 2023-09-21 ENCOUNTER — Ambulatory Visit (INDEPENDENT_AMBULATORY_CARE_PROVIDER_SITE_OTHER): Payer: 59 | Admitting: Internal Medicine

## 2023-09-21 VITALS — BP 126/82 | HR 67 | Resp 16 | Ht 71.0 in | Wt 315.1 lb

## 2023-09-21 DIAGNOSIS — G4710 Hypersomnia, unspecified: Secondary | ICD-10-CM

## 2023-09-21 DIAGNOSIS — R0681 Apnea, not elsewhere classified: Secondary | ICD-10-CM

## 2023-09-21 NOTE — Progress Notes (Signed)
Sleep Medicine   Office Visit  Patient Name: Jonathan Lam DOB: 1981-03-30 MRN [id number]    Chief Complaint: sleepy during day  Brief History:  Jonathan Lam presents for an initial sleep evaluation with a reported 5+ year  history of waking tired, loud snoring,choking in his sleep and EDS. Patient is screening for bariatric surgery.  Patient reports his sleep quality is poor due to waking tired each day. This is noted every night. The patient's bed partner reports  witnessed apneas and loud snoring at night. The patient relates the following symptoms: EDS, snoring and choking that wakes him up, waking tired, brain fog, lack of focus, sleepy driving and using caffeine to stay alert are also present. The patient goes to sleep at 9:00pm and wakes up at 5:30am.  Patient reports waking 5-6 times per night. He isn't sure why he wakes this many times a night.  Sleep quality is described as terrible  when outside home environment.  Patient has noted restlessness of his legs at night.  The patient  relates talking behavior during the night.  The patient reports a history of psychiatric problems (anxiety) . The Epworth Sleepiness Score is 24 out of 24 .  The patient relates  Cardiovascular risk factors include: palpitations.    ROS  General: (-) fever, (-) chills, (-) night sweat Nose and Sinuses: (-) nasal stuffiness or itchiness, (-) postnasal drip, (-) nosebleeds, (-) sinus trouble. Mouth and Throat: (-) sore throat, (-) hoarseness. Neck: (-) swollen glands, (-) enlarged thyroid, (-) neck pain. Respiratory: - cough, - shortness of breath, - wheezing. Neurologic: - numbness, - tingling. Psychiatric: + anxiety, - depression Sleep behavior: -sleep paralysis -hypnogogic hallucinations -dream enactment      -vivid dreams -cataplexy -night terrors -sleep walking   Current Medication: Outpatient Encounter Medications as of 09/21/2023  Medication Sig   acetaminophen (TYLENOL) 650 MG CR tablet Take 650  mg by mouth every 8 (eight) hours as needed for pain.   ADDERALL 10 MG tablet Take 10 mg by mouth daily.   citalopram (CELEXA) 20 MG tablet Take 20 mg by mouth daily.   EPINEPHrine 0.3 mg/0.3 mL IJ SOAJ injection Inject 0.3 mLs (0.3 mg total) into the muscle as needed for anaphylaxis. Can substitute generic (or cheapest)   VITAMIN D PO Take 1 tablet by mouth daily.   No facility-administered encounter medications on file as of 09/21/2023.    Surgical History: Past Surgical History:  Procedure Laterality Date   LEG SURGERY Right     Medical History: Past Medical History:  Diagnosis Date   ADHD     Family History: Non contributory to the present illness  Social History: Social History   Socioeconomic History   Marital status: Married    Spouse name: Not on file   Number of children: Not on file   Years of education: Not on file   Highest education level: Not on file  Occupational History   Not on file  Tobacco Use   Smoking status: Former    Current packs/day: 0.00    Average packs/day: 1.5 packs/day for 20.0 years (30.0 ttl pk-yrs)    Types: Cigarettes    Start date: 02/20/1991    Quit date: 02/20/2011    Years since quitting: 12.5   Smokeless tobacco: Never  Substance and Sexual Activity   Alcohol use: Not Currently    Alcohol/week: 3.0 standard drinks of alcohol    Types: 1 Glasses of wine, 1 Cans of beer, 1 Shots of liquor  per week   Drug use: No   Sexual activity: Yes  Other Topics Concern   Not on file  Social History Narrative   Not on file   Social Drivers of Health   Financial Resource Strain: Low Risk  (08/13/2022)   Received from [facility], [facility]   Overall Financial Resource Strain (CARDIA)    Difficulty of Paying Living Expenses: Not hard at all  Food Insecurity: No Food Insecurity (08/13/2022)   Received from [facility], [facility]   Hunger Vital Sign    Worried About Running Out of Food in the Last Year: Never true    Ran  Out of Food in the Last Year: Never true  Transportation Needs: No Transportation Needs (08/13/2022)   Received from [facility], [facility]   [facility] - Transportation    Lack of Transportation (Medical): No    Lack of Transportation (Non-Medical): No  Physical Activity: Not on file  Stress: Not on file  Social Connections: Not on file  Intimate Partner Violence: Not on file    Vital Signs: Blood pressure 126/82, pulse 67, resp. rate 16, height 5\' 11"  (1.803 m), weight (!) 315 lb 1.6 oz (142.9 kg), SpO2 98%. Body mass index is 43.95 kg/m.   Examination: General Appearance: The patient is well-developed, well-nourished, and in no distress. Neck Circumference: 43cm Skin: Gross inspection of skin unremarkable. Head: normocephalic, no gross deformities. Eyes: no gross deformities noted. ENT: ears appear grossly normal Neurologic: Alert and oriented. No involuntary movements.    STOP BANG RISK ASSESSMENT S (snore) Have you been told that you snore?     YES   T (tired) Are you often tired, fatigued, or sleepy during the day?   YES  O (obstruction) Do you stop breathing, choke, or gasp during sleep? YES   P (pressure) Do you have or are you being treated for high blood pressure? NO   B (BMI) Is your body index greater than 35 kg/m? YES   A (age) Are you 51 years old or older? NO   N (neck) Do you have a neck circumference greater than 16 inches?   YES   G (gender) Are you a male? YES   TOTAL STOP/BANG "YES" ANSWERS 6                                                               A STOP-Bang score of 2 or less is considered low risk, and a score of 5 or more is high risk for having either moderate or severe OSA. For people who score 3 or 4, doctors may need to perform further assessment to determine how likely they are to have OSA.         EPWORTH SLEEPINESS SCALE:  Scale:  (0)= no chance of dozing; (1)= slight chance of dozing; (2)= moderate chance of dozing;  (3)= high chance of dozing  Chance  Situtation    Sitting and reading: 3    Watching TV: 3    Sitting Inactive in public: 3    As a passenger in car: 3      Lying down to rest: 3    Sitting and talking: 3    Sitting quielty after lunch: 3  In a car, stopped in traffic: 3   TOTAL SCORE:   24 out of 24    SLEEP STUDIES:  HST 09/14/2018 - negative for OSA   LABS: No results found for this or any previous visit (from the past 2160 hours).  Radiology: No results found.  No results found.  No results found.    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 Hypersomnia (Primary) Patient evaluation suggests high risk of sleep disordered breathing due to snoring, gasping choking, witnessed apnea, extreme daytime sleepiness, brain fog, difficulty focusing.  Patient has comorbid cardiovascular risk factors including: palpitations which could be exacerbated by pathologic sleep-disordered breathing.  Suggest: PSG to assess/treat the patient's sleep disordered breathing. The patient was also counselled on weight loss to optimize sleep health.   2. Witnessed episode of apnea Await psg for evaluation  3. Morbid obesity (HCC). Undergoing bariatric surgery evaluation at [facility].  Obesity Counseling: Had a lengthy discussion regarding patients BMI and weight issues. Patient was instructed on portion control as well as increased activity. Also discussed caloric restrictions with trying to maintain intake less than 2000 Kcal. Discussions were made in accordance with the 5As of weight management. Simple actions such as not eating late and if able to, taking a walk is suggested.    PLAN OSA:   Patient evaluation suggests high risk of sleep disordered breathing due to snoring,  gasping choking, witnessed apnea, extreme daytime sleepiness, brain fog, difficulty focusing.  Patient has comorbid cardiovascular risk factors including: palpitations which could be exacerbated by pathologic sleep-disordered breathing.  Suggest: PSG to assess/treat the patient's sleep disordered breathing. The patient was also counselled on weight loss to optimize sleep health.   General Counseling: I have discussed the findings of the evaluation and examination with Jonathan Lam.  I have also discussed any further diagnostic evaluation thatmay be needed or ordered today. Jonathan Lam verbalizes understanding of the findings of todays visit. We also reviewed his medications today and discussed drug interactions and side effects including but not limited excessive drowsiness and altered mental states. We also discussed that there is always a risk not just to him but also people around him. he has been encouraged to call the office with any questions or concerns that should arise related to todays visit.  No orders of the defined types were placed in this encounter.       I have personally obtained a history, evaluated the patient, evaluated pertinent data, formulated the assessment and plan and placed orders.   This patient was seen today by Emmaline Kluver, PA-C in collaboration with Dr. Freda Munro.   Yevonne Pax, MD [facility] Diplomate ABMS Pulmonary and Critical Care Medicine Sleep medicine
